# Patient Record
Sex: Female | Born: 1965 | Race: White | Hispanic: No | Marital: Married | Smoking: Current every day smoker
Health system: Southern US, Community
[De-identification: ages and names within clinical notes are randomized; demographics above are authoritative.]

## PROBLEM LIST (undated history)

## (undated) DIAGNOSIS — T8859XA Other complications of anesthesia, initial encounter: Secondary | ICD-10-CM

## (undated) DIAGNOSIS — I5181 Takotsubo syndrome: Secondary | ICD-10-CM

## (undated) DIAGNOSIS — M797 Fibromyalgia: Secondary | ICD-10-CM

## (undated) DIAGNOSIS — G4730 Sleep apnea, unspecified: Secondary | ICD-10-CM

## (undated) DIAGNOSIS — M5430 Sciatica, unspecified side: Secondary | ICD-10-CM

## (undated) DIAGNOSIS — Z9889 Other specified postprocedural states: Secondary | ICD-10-CM

## (undated) DIAGNOSIS — F419 Anxiety disorder, unspecified: Secondary | ICD-10-CM

## (undated) DIAGNOSIS — M1610 Unilateral primary osteoarthritis, unspecified hip: Secondary | ICD-10-CM

## (undated) DIAGNOSIS — J449 Chronic obstructive pulmonary disease, unspecified: Secondary | ICD-10-CM

## (undated) DIAGNOSIS — L509 Urticaria, unspecified: Secondary | ICD-10-CM

## (undated) DIAGNOSIS — J9690 Respiratory failure, unspecified, unspecified whether with hypoxia or hypercapnia: Secondary | ICD-10-CM

## (undated) DIAGNOSIS — T4145XA Adverse effect of unspecified anesthetic, initial encounter: Secondary | ICD-10-CM

## (undated) HISTORY — DX: Urticaria, unspecified: L50.9

## (undated) HISTORY — DX: Fibromyalgia: M79.7

## (undated) HISTORY — DX: Takotsubo syndrome: I51.81

## (undated) HISTORY — DX: Sciatica, unspecified side: M54.30

## (undated) HISTORY — DX: Unilateral primary osteoarthritis, unspecified hip: M16.10

## (undated) HISTORY — PX: CARPAL TUNNEL RELEASE: SHX101

## (undated) HISTORY — DX: Anxiety disorder, unspecified: F41.9

## (undated) HISTORY — PX: BREAST SURGERY: SHX581

## (undated) HISTORY — DX: Other specified postprocedural states: Z98.890

## (undated) HISTORY — DX: Respiratory failure, unspecified, unspecified whether with hypoxia or hypercapnia: J96.90

## (undated) HISTORY — PX: CHOLECYSTECTOMY: SHX55

## (undated) HISTORY — PX: TUBAL LIGATION: SHX77

---

## 1998-03-09 ENCOUNTER — Emergency Department (HOSPITAL_COMMUNITY): Admission: EM | Admit: 1998-03-09 | Discharge: 1998-03-09 | Payer: Self-pay | Admitting: Emergency Medicine

## 2013-06-28 ENCOUNTER — Encounter: Payer: Self-pay | Admitting: Neurology

## 2013-07-01 ENCOUNTER — Ambulatory Visit: Payer: 59 | Admitting: Neurology

## 2013-07-01 ENCOUNTER — Encounter (HOSPITAL_BASED_OUTPATIENT_CLINIC_OR_DEPARTMENT_OTHER): Payer: Self-pay

## 2013-07-01 ENCOUNTER — Emergency Department (HOSPITAL_BASED_OUTPATIENT_CLINIC_OR_DEPARTMENT_OTHER)
Admission: EM | Admit: 2013-07-01 | Discharge: 2013-07-01 | Disposition: A | Discharge status: Home / Self Care | Payer: 59 | Attending: Emergency Medicine | Admitting: Emergency Medicine

## 2013-07-01 ENCOUNTER — Ambulatory Visit (INDEPENDENT_AMBULATORY_CARE_PROVIDER_SITE_OTHER): Payer: 59 | Admitting: Neurology

## 2013-07-01 ENCOUNTER — Telehealth: Payer: Self-pay | Admitting: Neurology

## 2013-07-01 ENCOUNTER — Encounter: Payer: Self-pay | Admitting: Neurology

## 2013-07-01 VITALS — BP 118/71 | HR 84 | Ht 64.5 in | Wt 134.0 lb

## 2013-07-01 DIAGNOSIS — R109 Unspecified abdominal pain: Secondary | ICD-10-CM

## 2013-07-01 DIAGNOSIS — R209 Unspecified disturbances of skin sensation: Secondary | ICD-10-CM

## 2013-07-01 DIAGNOSIS — F411 Generalized anxiety disorder: Secondary | ICD-10-CM | POA: Insufficient documentation

## 2013-07-01 DIAGNOSIS — Y9389 Activity, other specified: Secondary | ICD-10-CM | POA: Insufficient documentation

## 2013-07-01 DIAGNOSIS — Y929 Unspecified place or not applicable: Secondary | ICD-10-CM | POA: Insufficient documentation

## 2013-07-01 DIAGNOSIS — R531 Weakness: Secondary | ICD-10-CM

## 2013-07-01 DIAGNOSIS — G935 Compression of brain: Secondary | ICD-10-CM

## 2013-07-01 DIAGNOSIS — F17200 Nicotine dependence, unspecified, uncomplicated: Secondary | ICD-10-CM | POA: Insufficient documentation

## 2013-07-01 DIAGNOSIS — R202 Paresthesia of skin: Secondary | ICD-10-CM

## 2013-07-01 DIAGNOSIS — J45909 Unspecified asthma, uncomplicated: Secondary | ICD-10-CM | POA: Insufficient documentation

## 2013-07-01 DIAGNOSIS — S3981XA Other specified injuries of abdomen, initial encounter: Secondary | ICD-10-CM | POA: Insufficient documentation

## 2013-07-01 DIAGNOSIS — Z872 Personal history of diseases of the skin and subcutaneous tissue: Secondary | ICD-10-CM | POA: Insufficient documentation

## 2013-07-01 DIAGNOSIS — M1610 Unilateral primary osteoarthritis, unspecified hip: Secondary | ICD-10-CM | POA: Insufficient documentation

## 2013-07-01 DIAGNOSIS — X500XXA Overexertion from strenuous movement or load, initial encounter: Secondary | ICD-10-CM | POA: Insufficient documentation

## 2013-07-01 DIAGNOSIS — R5381 Other malaise: Secondary | ICD-10-CM

## 2013-07-01 MED ORDER — IBUPROFEN 600 MG PO TABS: 600.0000 mg | ORAL_TABLET | Freq: Four times a day (QID) | ORAL | Status: DC | PRN

## 2013-07-01 MED ORDER — METHOCARBAMOL 500 MG PO TABS: 500.0000 mg | ORAL_TABLET | Freq: Two times a day (BID) | ORAL | Status: DC

## 2013-07-01 MED ORDER — OXYCODONE-ACETAMINOPHEN 5-325 MG PO TABS: 2.0000 | ORAL_TABLET | ORAL | Status: DC | PRN

## 2013-07-01 MED ORDER — OXYCODONE-ACETAMINOPHEN 5-325 MG PO TABS
2.0000 | ORAL_TABLET | Freq: Once | ORAL | Status: AC
  Administered 2013-07-01: 2 via ORAL
  Filled 2013-07-01: qty 2

## 2013-07-01 NOTE — ED Notes (Signed)
Pt reports abdominal pain and "spasms" that has been intermittent x 1 week.

## 2013-07-01 NOTE — Progress Notes (Addendum)
[facility]  Provider:  Dr Hosie Poisson Referring Provider: Emilee Hero,* Primary Care Physician:  Bunnie Philips, MD  CC:  Chronic pain   HPI:  Tracey Randolph is a 47 y.o. female here as a referral from Dr. Yevette Edwards for pain. Patient is currently prone on exam bed, stating she is in extreme pain. Pain is all over, non localizing. Reports burning sensation all over, has spasms in her legs. Recently had MRI Cervical spine done, they are unclear what it showed but state ortho told them there is no surgical intervention needed. Diagnosed with fibromyalgia around 1 year ago by PCP, started on Cymbalta. Saw another neurologist, Dr Craige Cotta at [facility] who told her she has fibromyalgia and sciatica of her left leg. Neither any visual changes vision loss. Does note episodes of lightheadedness, dizziness, denies any vertigo. The last few days has noticed bulge in her abdominal region, this resolves when she lies flat.  Per referral notes: Long-standing pain in neck, low back, bilateral arms and bilateral legs. Has spasms in LLE. Some difficulty sleeping. Generalized body aching and numbness. Feels balance is worse, having multiple falls. 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 Has a diagnosis of fibromyalgia. Currently takes Neurontin 600mg  BID and Cymbalta 90mg  daily.   Review of Systems: Out of a complete 14 system review, the patient complains of only the following symptoms, and all other reviewed systems are negative. Positive for weight gain fatigue blurred vision eye pain chest pain ringing in ears term pink cramps aching muscles constipation rash or loss confusion headache numbness weakness slurred speech dizziness restless leg anxiety none of sleep decreased energy change in appetite hallucinations  History   Social History  . Marital Status: Married    Spouse Name: N/A    Number of Children: 3  . Years of  Education: GED   Occupational History  .      [employer]   Social History Main Topics  . Smoking status: Current Every Day Smoker    Types: Cigarettes  . Smokeless tobacco: Never Used     Comment: 1to 1 1/2 daily  . Alcohol Use: No  . Drug Use: No  . Sexual Activity: Not on file   Other Topics Concern  . Not on file   Social History Narrative   The patient consumes several glasses of mt dew daily.    No family history on file.  Past Medical History  Diagnosis Date  . Fibromyalgia   . Sciatic nerve pain   . Anxiety   . Urticaria   . Hip arthritis     Past Surgical History  Procedure Laterality Date  . Carpal tunnel release    . Cesarean section      Current Outpatient Prescriptions  Medication Sig Dispense Refill  . ALPRAZolam (XANAX PO) Take by mouth.      . Aspirin-Acetaminophen-Caffeine (GOODY HEADACHE PO) Take by mouth.      . Cetirizine HCl (ZYRTEC PO) Take by mouth.      . DiphenhydrAMINE HCl (BENADRYL PO) Take by mouth.      . DULoxetine HCl (CYMBALTA PO) Take by mouth.      . Gabapentin (NEURONTIN PO) Take by mouth.      . TRAMADOL HCL PO Take by mouth.       No current facility-administered medications for this visit.    Allergies as of 07/01/2013  . (No Known Allergies)  Vitals: BP 118/71  Pulse 84  Ht 5' 4.5" (1.638 m)  Wt 134 lb (60.782 kg)  BMI 22.65 kg/m2 Last Weight:  Wt Readings from Last 1 Encounters:  07/01/13 134 lb (60.782 kg)   Last Height:   Ht Readings from Last 1 Encounters:  07/01/13 5' 4.5" (1.638 m)     Physical exam: Exam: Gen: patient prone on bed, reporting severe pain, not appearing in any distress Eyes: anicteric sclerae, moist conjunctivae HENT: Atraumatic, oropharynx clear Neck: Trachea midline; supple,  Lungs: CTA, no wheezing, rales, rhonic                          CV: RRR, no MRG Abdomen: Soft, non-tender;  Extremities: No peripheral edema  Skin: Normal temperature, no rash,  Psych:  Appropriate affect, pleasant  Neuro: Tracey: AA&Ox3, appropriately interactive, normal affect   Speech: fluent w/o paraphasic error  Memory: good recent and remote recall  CN: PERRL, EOMI no nystagmus, no ptosis, sensation intact to LT V1-V3 bilat, face symmetric, no weakness, hearing grossly intact, palate elevates symmetrically, shoulder shrug 5/5 bilat,  tongue protrudes midline, no fasiculations noted. Mild "no-no" head tremor noted  Motor: normal bulk and tone Strength: 5/5  In all extremities, noted give-way weakness in UE and LE  Coord: rapid alternating and point-to-point (FNF, HTS) movements intact. Mild bilateral intention tremor Reflexes: symmetrical, bilat downgoing toes  Sens: LT intact in all extremities  Gait: slow, antalgic gait, favoring left leg, wobbles/gets off balance with tandem but able to correct herself wtihout assistance, wobbles/falls but self corrects self in [place]  Assessment:  After physical and neurologic examination, review of laboratory studies, imaging, neurophysiology testing and pre-existing records, assessment will be reviewed on the problem list.  Plan:  Treatment plan and additional workup will be reviewed under Problem List.  Tracey Randolph is a pleasant 47y/o woman sent in for initial evaluation of chronic  pain, muscle spasms, gait instability. Has been ongoing for over a year. Has a diagnosis of fibromyalgia and is taking Gabapentin and Cymbalta 90 mg daily for this. With little to no relief with these medications. Is scheduled to see rheumatology in the future for further workup and treatment. Has recently been evaluated by orthopedic surgery with recent MRI of the C-spine per the patient they found no surgical indication. She's never had MRI of the brain. Unclear etiology of her symptoms, with age and diffuse symptoms would question possible multiple sclerosis. There does appear to be some functional overlay to her symptoms. Will check brain MRI with  and without contrast. Patient scheduled to followup with rheumatology. His patient to followup with primary care physician for evaluation of possible abdominal hernia.   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 Unlikely to fully explain patients presenting symptoms. Discussed results with patient, wishes to see neurosurgery for formal evaluation. Referral to be placed.

## 2013-07-01 NOTE — ED Provider Notes (Signed)
CSN: [id number]     Arrival date & time 07/01/13  1333 History   First MD Initiated Contact with Patient 07/01/13 1336     Chief Complaint  Patient presents with  . Abdominal Pain   (Consider location/radiation/quality/duration/timing/severity/associated sxs/prior Treatment) Patient is a 47 y.o. female presenting with abdominal pain. The history is provided by the patient.  Abdominal Pain Associated symptoms: no vaginal bleeding and no vaginal discharge    patient here complaining of abdominal wall pain and spasms asthma week after lifting a heavy tire. No associated fever or chills. No vomiting or diarrhea. No weakness in her legs. Denies any urinary symptoms. Pain characterized as sharp and worse with certain movements. Used her home medications without relief. Denies any back pain.  Past Medical History  Diagnosis Date  . Fibromyalgia   . Sciatic nerve pain   . Anxiety   . Urticaria   . Hip arthritis    Past Surgical History  Procedure Laterality Date  . Carpal tunnel release Right   . Cesarean section     Family History  Problem Relation Age of Onset  . Heart Problems Brother   . Heart Problems Maternal Grandfather    History  Substance Use Topics  . Smoking status: Current Every Day Smoker -- 2.00 packs/day    Types: Cigarettes  . Smokeless tobacco: Never Used     Comment: 1to 1 1/2 daily  . Alcohol Use: No   OB History   Grav Para Term Preterm Abortions TAB SAB Ect Mult Living                 Review of Systems  Gastrointestinal: Positive for abdominal pain.  Genitourinary: Negative for vaginal bleeding and vaginal discharge.  All other systems reviewed and are negative.    Allergies  Review of patient's allergies indicates no known allergies.  Home Medications   Current Outpatient Rx  Name  Route  Sig  Dispense  Refill  . ALPRAZolam (XANAX PO)   Oral   Take by mouth.         . Aspirin-Acetaminophen-Caffeine (GOODY HEADACHE PO)   Oral   Take by  mouth.         . Cetirizine HCl (ZYRTEC PO)   Oral   Take by mouth.         . DiphenhydrAMINE HCl (BENADRYL PO)   Oral   Take by mouth.         . DULoxetine HCl (CYMBALTA PO)   Oral   Take by mouth.         . Gabapentin (NEURONTIN PO)   Oral   Take by mouth.         . TRAMADOL HCL PO   Oral   Take by mouth.          BP 145/92  Pulse 88  Temp(Src) 98.4 F (36.9 C) (Oral)  Resp 16  Ht 5\' 3"  (1.6 m)  Wt 134 lb (60.782 kg)  BMI 23.74 kg/m2  SpO2 98% Physical Exam  Nursing note and vitals reviewed. Constitutional: She is oriented to person, place, and time. She appears well-developed and well-nourished.  Non-toxic appearance. No distress.  HENT:  Head: Normocephalic and atraumatic.  Eyes: Conjunctivae, EOM and lids are normal. Pupils are equal, round, and reactive to light.  Neck: Normal range of motion. Neck supple. No tracheal deviation present. No mass present.  Cardiovascular: Normal rate, regular rhythm and normal heart sounds.  Exam reveals no gallop.   No  murmur heard. Pulmonary/Chest: Effort normal and breath sounds normal. No stridor. No respiratory distress. She has no decreased breath sounds. She has no wheezes. She has no rhonchi. She has no rales.  Abdominal: Soft. Normal appearance and bowel sounds are normal. She exhibits no distension. There is tenderness in the right lower quadrant, suprapubic area and left lower quadrant. There is no rigidity, no rebound, no guarding and no CVA tenderness. No hernia. Hernia confirmed negative in the ventral area, confirmed negative in the right inguinal area and confirmed negative in the left inguinal area.    No bruising appreciated. No erythema to the skin. Patient's abdomen is nonsurgical  Musculoskeletal: Normal range of motion. She exhibits no edema and no tenderness.  Neurological: She is alert and oriented to person, place, and time. She has normal strength. No cranial nerve deficit or sensory deficit.  GCS eye subscore is 4. GCS verbal subscore is 5. GCS motor subscore is 6.  Skin: Skin is warm and dry. No abrasion and no rash noted.  Psychiatric: She has a normal mood and affect. Her speech is normal and behavior is normal.    ED Course  Procedures (including critical care time) Labs Review Labs Reviewed - No data to display Imaging Review No results found.  MDM  No diagnosis found. Patient given Percocet for pain. Suspect that she has abdominal wall pain at this time. We'll treat appropriately    Toy Baker, MD 07/01/13 [phone]

## 2013-07-01 NOTE — Patient Instructions (Addendum)
Overall you are doing fairly well but I do want to suggest a few things today:   As far as diagnostic testing: I would like to get a MRI of the brain  I would like you to follow up with rheumatology for further evaluation of your fibromyalgia.  Please follow up with your primary care physician for evaluation of a possible abdominal hernia.  Please also call us for any test results so we can go over those with you on the phone.  My clinical assistant and will answer any of your questions and relay your messages to me and also relay most of my messages to you.   Our phone number is [phone]. We also have an after hours call service for urgent matters and there is a physician on-call for urgent questions. For any emergencies you know to call 911 or go to the nearest emergency room

## 2013-07-02 NOTE — Telephone Encounter (Signed)
I see pt went to ED.

## 2013-07-04 ENCOUNTER — Ambulatory Visit (INDEPENDENT_AMBULATORY_CARE_PROVIDER_SITE_OTHER): Payer: 59

## 2013-07-04 DIAGNOSIS — R209 Unspecified disturbances of skin sensation: Secondary | ICD-10-CM

## 2013-07-04 DIAGNOSIS — R202 Paresthesia of skin: Secondary | ICD-10-CM

## 2013-07-04 DIAGNOSIS — R5381 Other malaise: Secondary | ICD-10-CM

## 2013-07-04 DIAGNOSIS — R531 Weakness: Secondary | ICD-10-CM

## 2013-07-05 MED ORDER — GADOPENTETATE DIMEGLUMINE 469.01 MG/ML IV SOLN: 12.0000 mL | Freq: Once | INTRAVENOUS | Status: AC | PRN

## 2013-07-06 ENCOUNTER — Encounter: Payer: Self-pay | Admitting: Neurology

## 2013-07-08 ENCOUNTER — Telehealth: Payer: Self-pay | Admitting: Neurology

## 2013-07-08 NOTE — Addendum Note (Signed)
Addended by: Ramond Marrow on: 07/08/2013 05:17 PM   Modules accepted: Orders

## 2013-07-09 ENCOUNTER — Telehealth: Payer: Self-pay | Admitting: Neurology

## 2013-07-09 ENCOUNTER — Encounter: Payer: Self-pay | Admitting: Neurology

## 2013-07-11 ENCOUNTER — Encounter: Payer: Self-pay | Admitting: Neurology

## 2013-07-11 ENCOUNTER — Other Ambulatory Visit: Payer: Self-pay | Admitting: Neurology

## 2013-07-11 NOTE — Telephone Encounter (Signed)
Patient states she is having trouble sleeping. She is requesting med management or a med to help with sleep.

## 2013-07-11 NOTE — Telephone Encounter (Signed)
Replied to patients email request for same issue. Started her on Melatonin 5mg  nightly

## 2013-08-08 ENCOUNTER — Other Ambulatory Visit: Payer: Self-pay

## 2013-08-20 ENCOUNTER — Telehealth: Payer: Self-pay | Admitting: Neurology

## 2013-08-20 NOTE — Telephone Encounter (Signed)
MRI inquiring about  was  From another doctor's office,explained to patient , she understood but  wanted to speak with Dr Hosie Poisson concerning report from hand surgeon. Informed patient that we did not have report as of yet, patient will have their  office fax  and will give to Dr Hosie Poisson for review.

## 2013-08-22 ENCOUNTER — Telehealth: Payer: Self-pay | Admitting: Neurology

## 2013-08-22 NOTE — Telephone Encounter (Signed)
Received fax copy of ov notes from Dr Val Riles office, informed patient

## 2013-08-22 NOTE — Telephone Encounter (Signed)
Called patient back to discuss recent MRI findings and NSX visit. Patient does not agree with NSX findings that there is no surgical indication. Explained to her that she should keep her planned rheumatology appointment to see what they have to say. I counseled her that I agree with NSX that her symptoms are not coming from the [name]. She expressed understanding.

## 2013-08-22 NOTE — Telephone Encounter (Signed)
Have called Dr Val Riles office and they will fax over the Office notes to our office for Dr Hosie Poisson to review, called patient to inform that once he reviews, will contact patient

## 2013-08-28 ENCOUNTER — Telehealth: Payer: Self-pay | Admitting: Neurology

## 2013-08-28 NOTE — Telephone Encounter (Signed)
Spoke with patient and she said that she wants to speak with Dr Hosie Poisson concerning a research on [name] study that she is interested in participating with.  It is [facility]-[email or web address].

## 2013-09-02 NOTE — Telephone Encounter (Signed)
Patient called, answered her questions. She continues to have concerns over [name] which she believes is causing her symptoms. I again reiterated to her that I believe, as does neurosurgery, that this was likely an incidental finding. She wishes to have a 2nd opinion and will consider following up with [facility] or [facility]

## 2014-10-23 ENCOUNTER — Telehealth: Payer: Self-pay | Admitting: Neurology

## 2014-10-23 NOTE — Telephone Encounter (Signed)
Pt called in she saw Dr Hosie PoissonSumner in 2014 she wants to know if we do any kind of testing for [name] Please call [phone] dg

## 2014-10-27 NOTE — Telephone Encounter (Signed)
Patient calling back regarding message left on 10/23/14.  Please call and advise.

## 2014-10-30 NOTE — Telephone Encounter (Signed)
I called pt and [name] was not what she wanted to know about.  [name] or [name] test was what she is asking about.  I relayed that [facility] is the speciality lab that we use for special neuro tests.  [facility]wn by [employer]. After orders per MD, call placed to [place] to see if insurance in network and then insurance called.  Pt stated she called and her insurance does cover.  She has to pay on her deductible.   Pts brother had this genetic disorder.

## 2014-11-05 ENCOUNTER — Ambulatory Visit (INDEPENDENT_AMBULATORY_CARE_PROVIDER_SITE_OTHER): Payer: 59 | Admitting: Neurology

## 2014-11-05 ENCOUNTER — Encounter: Payer: Self-pay | Admitting: Neurology

## 2014-11-05 VITALS — BP 127/80 | HR 103 | Ht 63.0 in | Wt 148.0 lb

## 2014-11-05 DIAGNOSIS — E538 Deficiency of other specified B group vitamins: Secondary | ICD-10-CM

## 2014-11-05 DIAGNOSIS — G609 Hereditary and idiopathic neuropathy, unspecified: Secondary | ICD-10-CM

## 2014-11-05 NOTE — Patient Instructions (Signed)
Overall you are doing fairly well but I do want to suggest a few things today:   Remember to drink plenty of fluid, eat healthy meals and do not skip any meals. Try to eat protein with a every meal and eat a healthy snack such as fruit or nuts in between meals. Try to keep a regular sleep-wake schedule and try to exercise daily, particularly in the form of walking, 20-30 minutes a day, if you can.   As far as diagnostic testing: labs  Please call us with any interim questions, concerns, problems, updates or refill requests.   Please also call us for any test results so we can go over those with you on the phone.  My clinical assistant and will answer any of your questions and relay your messages to me and also relay most of my messages to you.   Our phone number is [phone]. We also have an after hours call service for urgent matters and there is a physician on-call for urgent questions. For any emergencies you know to call 911 or go to the nearest emergency room

## 2014-11-05 NOTE — Progress Notes (Addendum)
[facility]    Provider:  Dr Lucia GaskinsAhern Referring Provider: No ref. provider found Primary Care Physician:  ADAM, Sanda KleinSOHEIR SAEED, MD  CC:  [name]  HPI:  Tracey Randolph is a 49 y.o. female here as a follow up. She is a former patient of Dr. Hosie PoissonSumner. I have never seen her and she is a new patient to me and unfortunately she was only scheduled for a 15 minute follow up appointment slot despite being a new patient to me. She has multiple symptoms and complaints however today she is interested in discussing Charcot-Marie tooth. Her brother passed away and he had [name]. She reports that she is having the same problems that he was. She is having neck pain, numbness in her hands, pain in her arms, she wakes up every morning and her toes are curled up, her feet burn and hurt badly, she has weakness in her left leg, she is "sweating like crazy", she has bad headaches, she has insomnia. She had respiratory symptoms, recently and [facility]. Then [facility], she had stress-induced cardiomyopathy, she is having chest pain, chronic abdominal pain, chronic whole body pain. She reports she was active when she was young, but her legs hurt at night "and kicked and kicked". Her knees swell. Hands swell. She reports falls, trips a lot. She runs into the wall.  She follows with a rhematologist who has worked her up extensively. Has been to multiple rheumatologists.   MRI of the brain: Normal MRI scan of the brain with and without contrast. 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  Initial visit 07/01/2013 with Dr. Hosie PoissonSumner: Tracey Randolph is a 49 y.o. female here as a referral from Dr. Yevette Edwardsumonski for pain. Patient is currently prone on exam bed, stating she is in extreme pain. Pain is all over, non localizing. Reports burning sensation all over, has spasms in her legs. Recently had MRI Cervical spine done, they are unclear what it showed but state ortho told them  there is no surgical intervention needed. Diagnosed with fibromyalgia around 1 year ago by PCP, started on Cymbalta. Saw another neurologist, [facility]ir[facility] who told her she has fibromyalgia and sciatica of her left leg. Neither any visual changes vision loss. Does note episodes of lightheadedness, dizziness, denies any vertigo. The last few days has noticed bulge in her abdominal region, this resolves when she lies flat.  Per referral notes: Long-standing pain in neck, low back, bilateral arms and bilateral legs. Has spasms in LLE. Some difficulty sleeping. Generalized body aching and numbness. Feels balance is worse, having multiple falls. 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 Has a diagnosis of fibromyalgia. Currently takes Neurontin 600mg  BID and Cymbalta 90mg  daily  Review of Systems: Patient complains of symptoms per HPI as well as the following symptoms activity change, appetite change, fever, fatigue, wt change, sweating, light sensitivity, loss of vision, blurred vision, cold and heat intolerance, facial swelling, ringing in ears, trouble swallowing, CP, constipation, nausea, frequency or urination, blood in urine, joint pain and swelling, back pain, aching muscles, muscle cramps, walking difficulty, neck pain and stiffness, agitation, memory loss, dizziness, headache, numbness, speech difficulty, weakness, agitation, behavior problem, confusion, nervous/anxious. . Pertinent negatives per HPI. All others negative.   History   Social History  . Marital Status: Married    Spouse Name: N/A    Number of Children: 3  . Years  of Education: GED   Occupational History  .      [employer]   Social History Main Topics  . Smoking status: Current Every Day Smoker -- 2.00 packs/day    Types: Cigarettes  . Smokeless tobacco: Never Used     Comment: 1to 1 1/2 daily  . Alcohol Use: No  . Drug Use: No  . Sexual Activity: Not on file     Other Topics Concern  . Not on file   Social History Narrative   The patient consumes several glasses of mt dew daily.    Family History  Problem Relation Age of Onset  . Heart Problems Brother   . Heart Problems Maternal Grandfather     Past Medical History  Diagnosis Date  . Fibromyalgia   . Sciatic nerve pain   . Anxiety   . Urticaria   . Hip arthritis     Past Surgical History  Procedure Laterality Date  . Carpal tunnel release Right   . Cesarean section      Current Outpatient Prescriptions  Medication Sig Dispense Refill  . ALPRAZolam (XANAX PO) Take 5 mg by mouth.     . DiphenhydrAMINE HCl (BENADRYL PO) Take by mouth as needed (itching).     Marland Kitchen albuterol (PROVENTIL HFA;VENTOLIN HFA) 108 (90 BASE) MCG/ACT inhaler Inhale 2 puffs into the lungs.    . Cholecalciferol (VITAMIN D3) 5000 UNITS TABS Take 1 tablet by mouth daily.    . clemastine (TAVIST) 2.68 MG TABS tablet Take 2.68 mg by mouth 2 (two) times daily.    . montelukast (SINGULAIR) 10 MG tablet Take 10 mg by mouth.    . nortriptyline (PAMELOR) 25 MG capsule Take 25 mg by mouth.    Marland Kitchen omalizumab (XOLAIR) 150 MG injection Inject 150 mg into the skin.    Marland Kitchen omeprazole (PRILOSEC) 20 MG capsule Take 20 mg by mouth.    . Oxycodone HCl 10 MG TABS Take 10 mg by mouth every 8 (eight) hours.  0  . polyethylene glycol powder (GLYCOLAX/MIRALAX) powder Take by mouth.    . predniSONE (DELTASONE) 10 MG tablet Take 10 mg by mouth daily.  3  . venlafaxine (EFFEXOR) 75 MG tablet Take 150 mg by mouth.     No current facility-administered medications for this visit.    Allergies as of 11/05/2014 - Review Complete 07/01/2013  Allergen Reaction Noted  . Cefuroxime Rash 11/05/2014  . Ipratropium-albuterol Rash 11/05/2014  . Morphine Rash 11/05/2014    Vitals: Ht  (1.6 m)  Wt 148 lb (67.132 kg)  BMI 26.22 kg/m2 Last Weight:  Wt Readings from Last 1 Encounters:  11/05/14 148 lb (67.132 kg)   Last Height:   Ht  Readings from Last 1 Encounters:  11/05/14  (1.6 m)    FOCUSED NEURO EXAM:   Speech:    Speech is normal; fluent and spontaneous with normal comprehension.  Cognition:    The patient is oriented to person, place, and time;     Cranial Nerves:    The pupils are equal, round, and reactive to light.  Visual fields are full to finger confrontation. Extraocular movements are intact. Trigeminal sensation is intact. The face is symmetric. The palate elevates in the midline. Hearing grossly intact. Voice is normal. Shoulder shrug is normal. The tongue has normal motion without fasciculations.   Motor Observation:    Distal tapering/calf atrophy in the lower extremities, mildly high arches, no hammer toes,  Tone:  Normal muscle tone.    Posture:    Posture is normal.     Strength:    Give-way throughout     Sensation:  Decreased pin prick to knees and to deltoids, vibration and proprioception impaired in the great toes     Reflex Exam:  DTR's:  absent achilles DTRs,  Intact Patellar reflexes,   Assessment/Plan:  Ms Cressler is a 49y/o woman originally evaluated for chronic pain, muscle spasms, gait instability. Has a diagnosis of fibromyalgia and is taking Gabapentin and Cymbalta 90 mg daily for this. Is following with rheumatology. Has recently been evaluated by orthopedic surgery with recent MRI of the C-spine per the patient they found no surgical indication. MRI of the brain was unremarkable. She is here because her brother had [name] and she thinks she may have it too. She does have mildly high arches and tapering/ calf muscle atrophy distally in the legs. She had decrease to all sensory distally as well as absent achilles DTRs. Will perform neuropathy serum panel. Discussed [name] with patient and the different diagnostic modalities including emg/ncs (per notes, emg/ncs was already done in the past, would need to get the records and/or repeat procedure), nerve biopsy and Athena genetic  testing. Patient is interested in [place] genetic testing and would like the information. Discussed at length with her.    Naomie Dean, MD  [facility] [address] [place], [place] [address]  Phone [phone] Fax [phone]  A total of 30 minutes face-to-face was spent in with this patient. Over half this time was spent on counseling patient on [name] and different diagnostic and therapeutic options available.

## 2014-11-07 ENCOUNTER — Telehealth: Payer: Self-pay | Admitting: Neurology

## 2014-11-07 NOTE — Telephone Encounter (Signed)
Patient is calling for lab results. Please call. Thank you.

## 2014-11-10 ENCOUNTER — Telehealth: Payer: Self-pay | Admitting: *Deleted

## 2014-11-10 LAB — COMPREHENSIVE METABOLIC PANEL
A/G RATIO: 2.2 (ref 1.1–2.5)
ALK PHOS: 57 IU/L (ref 39–117)
ALT: 19 IU/L (ref 0–32)
AST: 17 IU/L (ref 0–40)
Albumin: 4.3 g/dL (ref 3.5–5.5)
BUN/Creatinine Ratio: 11 (ref 9–23)
BUN: 7 mg/dL (ref 6–24)
Bilirubin Total: <0.2 mg/dL (ref 0.0–1.2)
CHLORIDE: 98 mmol/L (ref 97–108)
CO2: 25 mmol/L (ref 18–29)
CREATININE: 0.61 mg/dL (ref 0.57–1.00)
Calcium: 10 mg/dL (ref 8.7–10.2)
GFR, EST AFRICAN AMERICAN: 122 mL/min/{1.73_m2} (ref >59)
GFR, EST NON AFRICAN AMERICAN: 106 mL/min/{1.73_m2} (ref >59)
Globulin, Total: 2 g/dL (ref 1.5–4.5)
Glucose: 61 mg/dL — ABNORMAL LOW (ref 65–99)
POTASSIUM: 4.4 mmol/L (ref 3.5–5.2)
SODIUM: 142 mmol/L (ref 134–144)
Total Protein: 6.3 g/dL (ref 6.0–8.5)

## 2014-11-10 LAB — HEMOGLOBIN A1C
Est. average glucose Bld gHb Est-mCnc: 111 mg/dL
Hgb A1c MFr Bld: 5.5 % (ref 4.8–5.6)

## 2014-11-10 LAB — IFE AND PE, SERUM
ALBUMIN/GLOB SERPL: 1.8 (ref 0.7–2.0)
ALPHA2 GLOB SERPL ELPH-MCNC: 0.7 g/dL (ref 0.4–1.2)
Albumin SerPl Elph-Mcnc: 4 g/dL (ref 3.2–5.6)
Alpha 1: 0.2 g/dL (ref 0.1–0.4)
B-GLOBULIN SERPL ELPH-MCNC: 0.8 g/dL (ref 0.6–1.3)
GLOBULIN, TOTAL: 2.3 g/dL (ref 2.0–4.5)
Gamma Glob SerPl Elph-Mcnc: 0.5 g/dL (ref 0.5–1.6)
IgA/Immunoglobulin A, Serum: 124 mg/dL (ref 91–414)
IgG (Immunoglobin G), Serum: 569 mg/dL — ABNORMAL LOW (ref 700–1600)
IgM (Immunoglobulin M), Srm: 48 mg/dL (ref 40–230)

## 2014-11-10 LAB — HIV ANTIBODY (ROUTINE TESTING W REFLEX): HIV Screen 4th Generation wRfx: NONREACTIVE

## 2014-11-10 LAB — CBC
HCT: 39 % (ref 34.0–46.6)
HEMOGLOBIN: 13.3 g/dL (ref 11.1–15.9)
MCH: 31.9 pg (ref 26.6–33.0)
MCHC: 34.1 g/dL (ref 31.5–35.7)
MCV: 94 fL (ref 79–97)
Platelets: 358 10*3/uL (ref 150–379)
RBC: 4.17 x10E6/uL (ref 3.77–5.28)
RDW: 13.4 % (ref 12.3–15.4)
WBC: 13.4 10*3/uL — AB (ref 3.4–10.8)

## 2014-11-10 LAB — B12 AND FOLATE PANEL
Folate: >20 ng/mL (ref >3.0)
Vitamin B-12: 481 pg/mL (ref 211–946)

## 2014-11-10 LAB — RPR: RPR: NONREACTIVE

## 2014-11-10 LAB — VITAMIN B1, WHOLE BLOOD: THIAMINE: 207.7 nmol/L — AB (ref 66.5–200.0)

## 2014-11-10 LAB — CRYOGLOBULIN, QL, SERUM, RFLX

## 2014-11-10 LAB — HEPATITIS C ANTIBODY: Hep C Virus Ab: <0.1 s/co ratio (ref 0.0–0.9)

## 2014-11-10 NOTE — Telephone Encounter (Signed)
Pt is calling again requesting her lab results. Please call and advise.

## 2014-11-10 NOTE — Telephone Encounter (Signed)
Pt is calling back to speak back with Kara MeadEmma with more questions regarding her lab results.  Please call and advise.

## 2014-11-10 NOTE — Telephone Encounter (Signed)
Talked with patient about Athena diagnostic testing. She wanted Dr. Lucia GaskinsAhern advise on whether to proceed with testing. Dr. Lucia GaskinsAhern will call patient back.

## 2014-11-10 NOTE — Telephone Encounter (Signed)
Left patient a voicemail to give us a call back .

## 2014-11-10 NOTE — Telephone Encounter (Signed)
Returning call.

## 2014-11-10 NOTE — Telephone Encounter (Signed)
Talked with patient about lab results. Patient verbalized understanding. Also spoke with patient about Athena diagnostics and gave her the cost that Jake Samplesthena estimated for as well as what her insurance would cover. I advised patient to also call insurance to find out the coverage. Patient stated she would call back to let us know if she wants to proceed with the lab testing.

## 2014-11-11 ENCOUNTER — Telehealth: Payer: Self-pay | Admitting: Neurology

## 2014-11-11 NOTE — Telephone Encounter (Signed)
Patient is returning your call of 2/9 approximately 4:15.  Please call.

## 2014-11-11 NOTE — Telephone Encounter (Signed)
I called and left message. It is up to patient whether she wants to do genetic testing. It would not change her management at this time. Thank you.

## 2014-11-12 NOTE — Telephone Encounter (Signed)
I called. Patient was not there again. I left a message, feel that it is up to patient whether she wants to spend the money to have [name] genetic testing. At this point it would not change our management. Thank you.

## 2014-11-13 ENCOUNTER — Encounter: Payer: Self-pay | Admitting: Neurology

## 2014-11-15 ENCOUNTER — Encounter: Payer: Self-pay | Admitting: Neurology

## 2014-11-18 ENCOUNTER — Telehealth: Payer: Self-pay | Admitting: *Deleted

## 2014-11-18 ENCOUNTER — Other Ambulatory Visit: Payer: Self-pay | Admitting: Neurology

## 2014-11-18 DIAGNOSIS — G609 Hereditary and idiopathic neuropathy, unspecified: Secondary | ICD-10-CM

## 2014-11-21 ENCOUNTER — Telehealth: Payer: Self-pay | Admitting: Neurology

## 2014-11-21 NOTE — Telephone Encounter (Signed)
Dr. Lucia GaskinsAhern already talked with this patient. See Dr. Trevor MaceAhern's notes.

## 2014-11-21 NOTE — Telephone Encounter (Signed)
Spoke to patient. Discussed [name] with patient and the different diagnostic modalities including emg/ncs (per notes, emg/ncs was already done in the past, would need to get the records and/or repeat procedure), nerve biopsy and Athena genetic testing. Patient is interested in [place] genetic testing. She would like to see a neuromuscular expert regarding diagnosis of [name]. I told her there is a Magazine features editorwonderful Neuromuscular doctor at [facility] and I am happy to refer patient. Thank you.

## 2014-12-18 ENCOUNTER — Ambulatory Visit (INDEPENDENT_AMBULATORY_CARE_PROVIDER_SITE_OTHER): Payer: 59 | Admitting: Neurology

## 2014-12-18 ENCOUNTER — Encounter: Payer: Self-pay | Admitting: Neurology

## 2014-12-18 VITALS — BP 130/86 | HR 76 | Ht 63.0 in | Wt 147.1 lb

## 2014-12-18 DIAGNOSIS — F32A Depression, unspecified: Secondary | ICD-10-CM

## 2014-12-18 DIAGNOSIS — G894 Chronic pain syndrome: Secondary | ICD-10-CM

## 2014-12-18 DIAGNOSIS — F329 Major depressive disorder, single episode, unspecified: Secondary | ICD-10-CM

## 2014-12-18 DIAGNOSIS — R269 Unspecified abnormalities of gait and mobility: Secondary | ICD-10-CM

## 2014-12-18 DIAGNOSIS — R202 Paresthesia of skin: Secondary | ICD-10-CM

## 2014-12-18 NOTE — Patient Instructions (Addendum)
Please call my office if you would like us to move forward with scheduling the nerve conduction study/EMG or order Tracey Randolph genetic testing for [name].

## 2014-12-18 NOTE — Progress Notes (Signed)
[facility] Clinic Note - Initial Visit   Date: 12/18/2014   Tracey Randolph MRN: [id number] DOB: 10/18/1965   Dear Dr. Lucia Gaskins:  Thank you for your kind referral of Tracey Randolph for consultation of neuropathy. Although her history is well known to you, please allow [place] to reiterate it for the purpose of our medical record. The patient was accompanied to the clinic by husband who also provides collateral information.     History of Present Illness: Tracey Randolph is a 49 y.o. 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    Starting around 2013, she reports having numbness or tingling of the arm and feet.  Over the years, she feels that her entire arm hurts, pain feels like a dull throbbing.  She does not have any sharp shooting pain down her arms.  Numbness/tingling involves her entire upper extremity (shoulder, upper arm, forearm, hands), groin, tail bone, and entire leg (thigh, legs, feet).  She feels that her toes are curling under.  She wears heavy socks, because her feet feel as if she is in ice water.  Walking makes it worse.  Nothing that alleviates her pain.   She was having severe back spasms, previously was active and now has difficulty walking even 6 feet.  She walks with a cane and walker for the past two years.  She has fallen about 5-6 times last year, and fortunately has not had any severe injuries.  Patient has previously seen a number of providers including four neurologists, three rheumatologists, neurosurgeon, and Location manager.  I am the fifth neurologist she is seeing. She also sees pain management, Dr. Anette Riedel in [place], and has a psychiatrist that she sees.  Patient has a number of chronic pain symptoms and carries a diagnosis of fibromyalgia. She has previously  tried Neurontin, Lyrica (started seeing snakes) and Cymbalta which did not help. She currently takes oxycodone which provides some relief.  Her most recent evaluation was at [facility] where she was first seen by Dr. Hosie Poisson in September 2014 for whole-body pain, gait instability, and muscle spasms.  Exam was notable for some functional qualities.  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  She saw Dr. 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  Her care was transitioned to Dr. Lucia Gaskins in February 2016, whose last clinic was reviewed.  Patient was very concerned about [name] a her brother passed away with it and she feels as if she has the same symptoms.  She had many other non-neurological complaints at that visit. Per patient, her brother was diagnosed with [name] by a neurologist, she does not think he had genetic testing. There is no other family history of any neuromuscular disease.  She is referred to our clinic for further evaluation for [name].  Prior to this, she saw Dr. Craige Cotta in [facility] who performed EMG which showed left sciatic nerve damage.   Out-side paper records, electronic medical record, and images have been reviewed where available and summarized as:  EMG of the lower extremities 08/28/2012:  Reduced tibial motor amplitudes and increased insertional activity in the sacral paraspinal muscles.  Sural sensory  responses were normal.     Labs 11/05/2014:  Vitamin B12 481, folate >20.0, cryoglobulin negative, SPEP/UPEP with IFE no M protein, RPR neg, HIV neg, Hepatitis C, thiamine 207, HbA1c 5.5, CMP nml  MRI brain wwo contrast 07/05/2013:  Normal, incidental finding of low lying cerebellar tonsils MRI  thoracic spine wo contrast 07/23/2012:  Normal thoracic spine.  Mild, noncompressive disc bulge at T3-4. MRI lumbar spine wo contrast 06/14/2012:  Normal   Past Medical History  Diagnosis Date  . Fibromyalgia   . Sciatic nerve pain   . Anxiety   . Urticaria   . Hip arthritis   . Respiratory failure     [facility]  . Stress-induced cardiomyopathy     [facility]     Past Surgical History  Procedure Laterality Date  . Carpal tunnel release Right   . Cesarean section       Medications:  Current Outpatient Prescriptions on File Prior to Visit  Medication Sig Dispense Refill  . albuterol (PROVENTIL HFA;VENTOLIN HFA) 108 (90 BASE) MCG/ACT inhaler Inhale 2 puffs into the lungs.    . DiphenhydrAMINE HCl (BENADRYL PO) Take by mouth as needed (itching).     . montelukast (SINGULAIR) 10 MG tablet Take 10 mg by mouth.    . nortriptyline (PAMELOR) 25 MG capsule Take 25 mg by mouth.    Marland Kitchen omeprazole (PRILOSEC) 20 MG capsule Take 20 mg by mouth.    . Oxycodone HCl 10 MG TABS Take 10 mg by mouth every 8 (eight) hours.  0  . polyethylene glycol powder (GLYCOLAX/MIRALAX) powder Take by mouth.    . predniSONE (DELTASONE) 10 MG tablet Take 10 mg by mouth daily.  3  . venlafaxine (EFFEXOR) 75 MG tablet Take 150 mg by mouth 2 (two) times daily with a meal.      No current facility-administered medications on file prior to visit.    Allergies:  Allergies  Allergen Reactions  . Cefuroxime Rash  . Ipratropium-Albuterol Rash  . Morphine Rash    "Patch form only"    Family History: Family History  Problem Relation Age of Onset  . Heart Problems Brother     Deceased  . Heart attack Maternal Grandfather   . Other Father     Multiorgan failure  . Healthy Mother     Living  . Charcot-Marie-Tooth disease Brother   . Heart attack Brother   . Healthy Son     x 2  . Healthy Daughter     x 1    Social History: History   Social History  . Marital Status: Married    Spouse  Name: N/A  . Number of Children: 3  . Years of Education: GED   Occupational History  .  Other    Owner [employer]   Social History Main Topics  . Smoking status: Former Smoker -- 2.00 packs/day for 25 years    Types: Cigarettes  . Smokeless tobacco: Never Used     Comment: 1to 1 1/2 daily, Quit December 2015  . Alcohol Use: No  . Drug Use: No  . Sexual Activity: Not on file   Other Topics Concern  . Not on file   Social History Narrative   The patient consumes several glasses of mt dew daily.     Lives with husband in a 2 story home.    Has trouble with the stairs.  Has 3 children.  Does not work.     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  Trying to get disability.     Education: some Financial trader.     Review of Systems:  CONSTITUTIONAL: No fevers, chills, night sweats, or weight loss.   EYES: No visual changes or eye pain ENT: No hearing changes.  No history of nose bleeds.   RESPIRATORY: No cough, wheezing and shortness of breath.   CARDIOVASCULAR: Negative for chest pain, and palpitations.   GI: Negative for abdominal discomfort, blood in stools or black stools.  No recent change in bowel habits.   GU:  No history of incontinence.   MUSCLOSKELETAL: +history of joint pain or swelling.  +myalgias.   SKIN: Negative for lesions, rash, and itching.   HEMATOLOGY/ONCOLOGY: Negative for prolonged bleeding, bruising easily, and swollen nodes.  No history of cancer.   ENDOCRINE: Negative for cold or heat intolerance, polydipsia or goiter.   PSYCH:  ++depression ++anxiety symptoms.   NEURO: As Above.   Vital Signs:  BP 130/86 mmHg  Pulse 76  Ht  (1.6 m)  Wt 147 lb 2 oz (66.735 kg)  BMI 26.07 kg/m2  SpO2 95%   General Medical Exam:   General: Anxious appearing, tearful at times, very shaky.   Eyes/ENT: see cranial nerve examination.   Neck:   No carotid bruits. Respiratory:  Clear to auscultation, good air entry bilaterally.     Cardiac:  Regular rate and rhythm, no murmur.   Extremities:  Mild pes cavus, no hammertoes  Skin:  Multiple skin excoriations of her forearms  Neurological Exam: MENTAL STATUS including orientation to time, place, person, recent and remote memory, attention span and concentration, language, and fund of knowledge is normal.  Speech is not dysarthric, speech can be tangential.  CRANIAL NERVES: II:  No visual field defects.  Unremarkable fundi.   III-IV-VI: Pupils equal round and reactive to light.  Normal conjugate, extra-ocular eye movements in all directions of gaze.  No nystagmus.  No ptosis.   V:  Normal facial sensation.  Jaw jerk is absent.   VII:  Normal facial symmetry and movements.  No pathologic facial reflexes.  VIII:  Normal hearing and vestibular function.   IX-X:  Normal palatal movement.   XI:  Normal shoulder shrug and head rotation.   XII:  Normal tongue strength and range of motion, no deviation or fasciculation.  MOTOR:  There is mild loss of muscle bulk over the distal lower extremity.  Accurate rating of patient's motor strength cannot be assessed as there is prominent giveaway weakness throughout. All muscle groups are at least antigravity. Tone is normal.    MSRs:  Right                                                                 Left brachioradialis 2+  brachioradialis 2+  biceps 2+  biceps 2+  triceps 2+  triceps 2+  patellar 2+  patellar 2+  ankle jerk 2+  ankle jerk 2+  Hoffman no  Hoffman no  plantar response down  plantar response down   SENSORY:  There is patchy vibrational loss over the right lower extremity the knees and distal to the ankle. Vibrational splittingis present at the for head. Pinprick, temperature, light touch is intact throughout. There is  mild sway with Romberg testing.   COORDINATION/GAIT: Normal finger-to- nose-finger.  Intact rapid alternating movements bilaterally.  Unable to rise from a chair without using arms.  Gait appears  very slow and cautious, antalgic, and assisted with a cane. There is no evidence of dragging of the feet. She is unable to perform stressed with tandem gait.    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 Patient presents with whole body pain, paresthesias, and gait difficulty.  The distribution of her pain and paresthesias does not conform to a typical pattern of neuropathy and her pain is out of portion to her exam. Further, there is nonphysiologic qualities to her clinical examination making accurate assessment of her motor strength difficult. I was able to elicit reflexes on today's exam, which were normal and symmetric. My suspicion for a hereditary neuropathy is low. I recommend repeat electrodiagnostic testing to look for axon loss and demyelinating changes, however patient does not wish to perform this test due to intolerance. Alternatively, I suggested that [name] genetic testing can be done, however this can be costly, pretest probability is low, and management would not change.  She has already contacted her insurance and [facility] and tells me that her out-of-pocket cost would be $1500.  At this point, she wishes to think about her options of EMG or genetic testing.  Additionally, she has other features of anxiety, depression, and chronic pain/fibromyalgia and is already established with pain management and psychiatry. I recommended that she see behavioral counselor for coping mechanisms and start water exercise program, as it is very important to maintain her psychological as well as physical health.   The duration of this appointment visit was 50 minutes of face-to-face time with the patient.  Greater than 50% of this time was spent in counseling, explanation of diagnosis, planning of further management, and coordination of care.   Thank you for allowing me to participate in patient's care.  If I can answer  any additional questions, I would be pleased to do so.    Sincerely,    Donika K. Allena KatzPatel, DO

## 2015-04-23 ENCOUNTER — Telehealth: Payer: Self-pay | Admitting: Neurology

## 2015-04-23 NOTE — Telephone Encounter (Signed)
Patient is interested in moving forward with the [name] testing discussed at her last visit however she would like for [place] to check with her insurance company to get it approved before scheduling please advise

## 2015-04-23 NOTE — Telephone Encounter (Signed)
Please give patient Tracey Randolph [phone], option 4 for billing and insurance questions and or [name] test code is 4010 [name] Advanced Evaluation.  She will need to call them directly and find out cost.

## 2015-04-23 NOTE — Telephone Encounter (Signed)
Pt states that she needs to talk to someone about a test please call [phone] she said the test was a TMT

## 2015-04-28 ENCOUNTER — Telehealth: Payer: Self-pay | Admitting: Neurology

## 2015-04-28 NOTE — Telephone Encounter (Signed)
Pt Tracey Randolph, [phone]/called about Warden/ranger

## 2015-04-29 NOTE — Telephone Encounter (Signed)
Called patient back and left message for her to call me.   

## 2015-04-29 NOTE — Telephone Encounter (Signed)
Left message for patient to call me back. 

## 2015-04-29 NOTE — Telephone Encounter (Signed)
Tracey Randolph, Azzaro C Cerutti/DOB: 2066-04-22/ returned your call @ 12:42p/call back @ [phone]

## 2015-04-30 ENCOUNTER — Telehealth: Payer: Self-pay | Admitting: Neurology

## 2015-04-30 NOTE — Telephone Encounter (Signed)
Patient would like genetic testing.  Do you know what code we use for insurance?

## 2015-04-30 NOTE — Telephone Encounter (Signed)
See next note

## 2015-04-30 NOTE — Telephone Encounter (Signed)
Left patient a message giving her the code and instructions to call the [employer] rep.

## 2015-04-30 NOTE — Telephone Encounter (Signed)
Pt returned phone call/call back @ [phone]

## 2015-04-30 NOTE — Telephone Encounter (Signed)
Let's order [employer] test code [phone], again - be sure she is aware of the cost.  Please put her in touch with [employer] representatives who can give her more information.

## 2015-05-06 ENCOUNTER — Telehealth: Payer: Self-pay | Admitting: Neurology

## 2015-05-06 NOTE — Telephone Encounter (Signed)
Called for "CMP" test results/ call back # [phone]

## 2015-05-06 NOTE — Telephone Encounter (Signed)
Patient said that no one has called her yet for the lab test.  I gave her to financial aid # to call.  I had mailed it to her but she said that she did not get it.  Informed her they should call after she speaks with the financial rep.

## 2015-05-07 ENCOUNTER — Telehealth: Payer: Self-pay | Admitting: Neurology

## 2015-05-07 NOTE — Telephone Encounter (Signed)
Pt called about cpt code/mulecular pathology procedure? Call back @ [phone]

## 2015-05-08 NOTE — Telephone Encounter (Signed)
Patient called back asking about test.  Informed her that it is a genetic test that will let us know if she is a carrier.

## 2015-06-05 DIAGNOSIS — G4730 Sleep apnea, unspecified: Secondary | ICD-10-CM

## 2015-06-05 DIAGNOSIS — L501 Idiopathic urticaria: Secondary | ICD-10-CM | POA: Insufficient documentation

## 2015-06-05 DIAGNOSIS — J383 Other diseases of vocal cords: Secondary | ICD-10-CM

## 2015-06-05 DIAGNOSIS — J3089 Other allergic rhinitis: Secondary | ICD-10-CM | POA: Insufficient documentation

## 2015-06-05 DIAGNOSIS — K219 Gastro-esophageal reflux disease without esophagitis: Secondary | ICD-10-CM | POA: Insufficient documentation

## 2015-06-05 DIAGNOSIS — J4540 Moderate persistent asthma, uncomplicated: Secondary | ICD-10-CM | POA: Insufficient documentation

## 2015-06-05 DIAGNOSIS — Z7952 Long term (current) use of systemic steroids: Secondary | ICD-10-CM | POA: Insufficient documentation

## 2015-06-12 ENCOUNTER — Other Ambulatory Visit: Payer: Self-pay | Admitting: *Deleted

## 2015-06-12 MED ORDER — OMALIZUMAB 150 MG ~~LOC~~ SOLR
300.0000 mg | SUBCUTANEOUS | Status: AC
Start: 2015-06-30 — End: ?
  Administered 2015-07-01 – 2015-10-22 (×5): 300 mg via SUBCUTANEOUS

## 2015-06-16 ENCOUNTER — Telehealth: Payer: Self-pay | Admitting: *Deleted

## 2015-06-16 NOTE — Telephone Encounter (Signed)
Not sure which test she is referring to - Morrie Sheldon please advise.

## 2015-06-16 NOTE — Telephone Encounter (Signed)
Patient calling about her lab results Call back number [phone]

## 2015-06-17 ENCOUNTER — Telehealth: Payer: Self-pay | Admitting: Neurology

## 2015-06-17 NOTE — Telephone Encounter (Signed)
See next note

## 2015-06-17 NOTE — Telephone Encounter (Signed)
Please inform patient that her [name] gene evaluation is negative for the most common gene mutations.   [employer] labs 06/15/2017:  GJB1, MFN2, MPZ, PMP22 gene mutations are all negative.  Will be scanned into epic.   [name], DO

## 2015-06-17 NOTE — Telephone Encounter (Signed)
Patient notified

## 2015-06-17 NOTE — Telephone Encounter (Signed)
Pt called and wanted her test results/Dawn CB# [phone]

## 2015-06-17 NOTE — Telephone Encounter (Signed)
Pt called for blood work results/ call back @ [phone]

## 2015-06-29 ENCOUNTER — Encounter: Payer: Self-pay | Admitting: Neurology

## 2015-07-01 ENCOUNTER — Ambulatory Visit (INDEPENDENT_AMBULATORY_CARE_PROVIDER_SITE_OTHER): Payer: 59 | Admitting: *Deleted

## 2015-07-01 DIAGNOSIS — L501 Idiopathic urticaria: Secondary | ICD-10-CM

## 2015-07-29 ENCOUNTER — Other Ambulatory Visit: Payer: Self-pay | Admitting: *Deleted

## 2015-07-29 ENCOUNTER — Ambulatory Visit (INDEPENDENT_AMBULATORY_CARE_PROVIDER_SITE_OTHER): Payer: 59

## 2015-07-29 DIAGNOSIS — L501 Idiopathic urticaria: Secondary | ICD-10-CM | POA: Diagnosis not present

## 2015-07-29 MED ORDER — ALBUTEROL SULFATE HFA 108 (90 BASE) MCG/ACT IN AERS: 2.0000 | INHALATION_SPRAY | RESPIRATORY_TRACT | Status: DC | PRN

## 2015-07-30 NOTE — Telephone Encounter (Signed)
Error

## 2015-08-26 ENCOUNTER — Ambulatory Visit (INDEPENDENT_AMBULATORY_CARE_PROVIDER_SITE_OTHER): Payer: 59 | Admitting: *Deleted

## 2015-08-26 DIAGNOSIS — L501 Idiopathic urticaria: Secondary | ICD-10-CM

## 2015-09-03 HISTORY — PX: HERNIA REPAIR: SHX51

## 2015-09-10 ENCOUNTER — Ambulatory Visit: Payer: 59 | Admitting: Allergy and Immunology

## 2015-09-14 ENCOUNTER — Encounter: Payer: Self-pay | Admitting: Allergy and Immunology

## 2015-09-14 ENCOUNTER — Ambulatory Visit (INDEPENDENT_AMBULATORY_CARE_PROVIDER_SITE_OTHER): Payer: 59 | Admitting: Allergy and Immunology

## 2015-09-14 VITALS — BP 120/86 | HR 110 | Resp 20

## 2015-09-14 DIAGNOSIS — J3089 Other allergic rhinitis: Secondary | ICD-10-CM

## 2015-09-14 DIAGNOSIS — L501 Idiopathic urticaria: Secondary | ICD-10-CM

## 2015-09-14 DIAGNOSIS — G4730 Sleep apnea, unspecified: Secondary | ICD-10-CM

## 2015-09-14 DIAGNOSIS — J4540 Moderate persistent asthma, uncomplicated: Secondary | ICD-10-CM | POA: Diagnosis not present

## 2015-09-14 DIAGNOSIS — J387 Other diseases of larynx: Secondary | ICD-10-CM | POA: Diagnosis not present

## 2015-09-14 DIAGNOSIS — J383 Other diseases of vocal cords: Secondary | ICD-10-CM

## 2015-09-14 DIAGNOSIS — K219 Gastro-esophageal reflux disease without esophagitis: Secondary | ICD-10-CM

## 2015-09-14 DIAGNOSIS — Z7952 Long term (current) use of systemic steroids: Secondary | ICD-10-CM

## 2015-09-14 NOTE — Patient Instructions (Signed)
  1. Continue all medications prescribed by Dr.Chodri including nebulized budesonide twice a day, montelukast 10 mg daily, and DuoNeb up to 2-3 times per day, and including tapering prednisone dose  2. Continue Xolair 300 mg every 4 weeks and EpiPen if needed  3. Continue omeprazole 40 mg twice a day  4. Continue Zyrtec if needed  5. Return to clinic in 12 weeks or earlier if problem  6. Daily steroids?

## 2015-09-14 NOTE — Progress Notes (Signed)
[facility] [place]  Follow-up Note  Refering Provider: Eloisa Northern, MD Primary Provider: Eloisa Northern, MD  Subjective:   Tracey Randolph is a 49 y.o. female who returns to the [facility] in re-evaluation of the following:  HPI Comments:  Tracey Randolph returns to this clinic on 09/14/2015 in reevaluation of her chronic urticaria treated with Xolair, asthma, allergic rhinitis, vocal cord dysfunction, and reflux-induced respiratory disease. She just got discharged from the hospital for an exacerbation of her asthma. It sounds as though she is seen Dr. Rachael Darby monthly since the fall for asthma is been receiving pulses of systemic steroids in the treatment of this condition while she continues to use various antiasthmatic medications. She tells me that she may of had a pneumonia with this most recent exacerbation but I chest x-ray dated on 09/12/2015 which was the day after her admission does not identify any significant abnormality. She is once again put on a prednisone taper and she is now using nebulized budesonide twice a day as well as montelukast. She has had a Nissen fundoplication 2 weeks prior to her exacerbation and is no longer on any ranitidine but does remain on omeprazole twice a day. She thinks that she actually did have some reflux episodes recently. She uses her DuoNeb 2-3 times per day which she thinks does help her somewhat.   Outpatient Encounter Prescriptions as of 09/14/2015  Medication Sig  . albuterol (PROVENTIL HFA;VENTOLIN HFA) 108 (90 BASE) MCG/ACT inhaler Inhale 2 puffs into the lungs every 4 (four) hours as needed for wheezing or shortness of breath.  . ASPIRIN PO Take by mouth.  . Black Cohosh 540 MG CAPS Take 1 tablet by mouth.  . cholecalciferol (VITAMIN D) 1000 UNITS tablet Take 5,000 Units by mouth daily.  Marland Kitchen desvenlafaxine (PRISTIQ) 100 MG 24 hr tablet Take 100 mg by mouth.  . DiphenhydrAMINE HCl  (BENADRYL PO) Take by mouth as needed (itching).   Marland Kitchen FLUoxetine HCl (PROZAC PO) Take by mouth.  . furosemide (LASIX) 20 MG tablet Take 20 mg by mouth daily.  Marland Kitchen ipratropium (ATROVENT) 0.02 % nebulizer solution 500 mcg.  . levofloxacin (LEVAQUIN) 500 MG tablet   . Melatonin 5 MG TABS Take by mouth.  . montelukast (SINGULAIR) 10 MG tablet Take 10 mg by mouth.  . nortriptyline (PAMELOR) 25 MG capsule Take 25 mg by mouth.  Marland Kitchen omeprazole (PRILOSEC) 20 MG capsule Take 20 mg by mouth.  . Oxycodone HCl 10 MG TABS Take 10 mg by mouth every 8 (eight) hours.  . polyethylene glycol powder (GLYCOLAX/MIRALAX) powder Take by mouth.  . predniSONE (DELTASONE) 10 MG tablet Take 10 mg by mouth daily.  . predniSONE (STERAPRED UNI-PAK 48 TAB) 10 MG (48) TBPK tablet   . ranitidine (ZANTAC) 300 MG capsule Take 300 mg by mouth every evening.  . venlafaxine (EFFEXOR) 75 MG tablet Take 150 mg by mouth 2 (two) times daily with a meal.   . Zolpidem Tartrate (AMBIEN PO) Take by mouth.   Facility-Administered Encounter Medications as of 09/14/2015  Medication  . omalizumab Geoffry Paradise) injection 300 mg    No orders of the defined types were placed in this encounter.    Past Medical History  Diagnosis Date  . Fibromyalgia   . Sciatic nerve pain   . Anxiety   . Urticaria   . Hip arthritis   . Respiratory failure [facility])     [facility]  . [facility]     Past Surgical History  Procedure Laterality Date  . Carpal tunnel release Right   . Cesarean section    . Hernia repair  [id number]    Allergies  Allergen Reactions  . Cefuroxime Rash  . Morphine Rash    "Patch form only"    Review of Systems  Constitutional: Negative.   HENT: Negative.   Eyes: Negative.   Respiratory: Positive for cough and shortness of breath.   Cardiovascular: Negative.   Gastrointestinal: Negative.        Reflux  Musculoskeletal: Positive for back pain and arthralgias.  Skin:  Negative.   Hematological: Negative.      Objective:   Filed Vitals:   09/14/15 1645  BP: 120/86  Pulse: 110  Resp: 20          Physical Exam  Constitutional: She appears well-developed and well-nourished. No distress.  HENT:  Head: Normocephalic and atraumatic. Head is without right periorbital erythema and without left periorbital erythema.  Right Ear: Tympanic membrane, external ear and ear canal normal. No drainage or tenderness. No foreign bodies. Tympanic membrane is not injected, not scarred, not perforated, not erythematous, not retracted and not bulging. No middle ear effusion.  Left Ear: Tympanic membrane, external ear and ear canal normal. No drainage or tenderness. No foreign bodies. Tympanic membrane is not injected, not scarred, not perforated, not erythematous, not retracted and not bulging.  No middle ear effusion.  Nose: Nose normal. No mucosal edema, rhinorrhea, nose lacerations or sinus tenderness.  No foreign bodies.  Mouth/Throat: Oropharynx is clear and moist. No oropharyngeal exudate, posterior oropharyngeal edema, posterior oropharyngeal erythema or tonsillar abscesses.  Eyes: Lids are normal. Right eye exhibits no chemosis, no discharge and no exudate. No foreign body present in the right eye. Left eye exhibits no chemosis, no discharge and no exudate. No foreign body present in the left eye. Right conjunctiva is not injected. Left conjunctiva is not injected.  Neck: Neck supple. No tracheal tenderness present. No tracheal deviation and no edema present. No thyroid mass and no thyromegaly present.  Cardiovascular: Normal rate, regular rhythm, S1 normal and S2 normal.  Exam reveals no gallop.   No murmur heard. Pulmonary/Chest: No accessory muscle usage or stridor. No respiratory distress. She has wheezes (end expiratory wheezing in all lung fields). She has no rhonchi. She has no rales.  Abdominal: Soft.  Lymphadenopathy:       Head (right side): No tonsillar  adenopathy present.       Head (left side): No tonsillar adenopathy present.    She has no cervical adenopathy.  Neurological: She is alert.  Skin: No rash noted. She is not diaphoretic.  Psychiatric: She has a normal mood and affect. Her behavior is normal.    Diagnostics:    Spirometry was not performed as per patient's wishes  The patient had an Asthma Control Test with the following results: ACT Total Score: 6.    Assessment and Plan:   1. Moderate persistent asthma, uncomplicated   2. Idiopathic urticaria   3. Other allergic rhinitis   4. Laryngopharyngeal reflux (LPR)   5. Long term current use of systemic steroids   6. Sleep apnea   7. Vocal cord dysfunction      1. Continue all medications prescribed by Dr.Chodri including nebulized budesonide twice a day, montelukast 10 mg daily, and DuoNeb up to 2-3 times per day, and including tapering prednisone dose  2. Continue Xolair 300 mg every 4  weeks and EpiPen if needed  3. Continue omeprazole 40 mg twice a day  4. Continue Zyrtec if needed  5. Return to clinic in 12 weeks or earlier if problem  6. Daily steroids?  Concerning Wynona Canes is chronic urticaria it is under excellent control utilizing her Xolair. Obviously her breathing issue is not under good control and she still requires intermittent use of systemic steroids after history of using prolonged systemic steroids on a daily basis. She does do better on systemic steroids on a daily basis regarding her respiratory tract disease but certainly this is not a very good long-term treatment plan. Hopefully with attention to her reflux now with a Nissen fundoplication we will be removing one of the triggers giving rise to her respiratory tract irritation and inflammation. She is going to follow with Dr. Blenda Nicely regarding the anti-asthmatic medicines. I'll see her back in this clinic in 12 weeks or earlier if there is a problem.       Laurette Schimke, MD [facility]

## 2015-09-18 ENCOUNTER — Ambulatory Visit: Payer: 59 | Admitting: *Deleted

## 2015-09-23 ENCOUNTER — Ambulatory Visit (INDEPENDENT_AMBULATORY_CARE_PROVIDER_SITE_OTHER): Payer: 59 | Admitting: *Deleted

## 2015-09-23 DIAGNOSIS — L501 Idiopathic urticaria: Secondary | ICD-10-CM | POA: Diagnosis not present

## 2015-10-02 ENCOUNTER — Other Ambulatory Visit: Payer: Self-pay | Admitting: Allergy and Immunology

## 2015-10-22 ENCOUNTER — Ambulatory Visit (INDEPENDENT_AMBULATORY_CARE_PROVIDER_SITE_OTHER): Payer: 59 | Admitting: Allergy and Immunology

## 2015-10-22 ENCOUNTER — Encounter: Payer: Self-pay | Admitting: Allergy and Immunology

## 2015-10-22 VITALS — BP 124/92 | HR 76 | Resp 24

## 2015-10-22 DIAGNOSIS — J383 Other diseases of vocal cords: Secondary | ICD-10-CM

## 2015-10-22 DIAGNOSIS — L501 Idiopathic urticaria: Secondary | ICD-10-CM | POA: Diagnosis not present

## 2015-10-22 DIAGNOSIS — J3089 Other allergic rhinitis: Secondary | ICD-10-CM | POA: Diagnosis not present

## 2015-10-22 DIAGNOSIS — J4540 Moderate persistent asthma, uncomplicated: Secondary | ICD-10-CM | POA: Diagnosis not present

## 2015-10-22 DIAGNOSIS — G4730 Sleep apnea, unspecified: Secondary | ICD-10-CM | POA: Diagnosis not present

## 2015-10-22 DIAGNOSIS — Z7952 Long term (current) use of systemic steroids: Secondary | ICD-10-CM

## 2015-10-22 NOTE — Patient Instructions (Addendum)
  1. Continue all medications prescribed by Dr.Chodri including nebulized budesonide twice a day, montelukast 10 mg daily, and DuoNeb up to 2-3 times per day, and including tapering prednisone dose at 30 mg a day for three days, followed by 20 mg a day for three days, followed by 10 mg a day for three days.  2. Continue Xolair 300 mg every 4 weeks and EpiPen if needed  3. Continue omeprazole 40 mg twice a day  4. Continue Zyrtec if needed.  5. Return to clinic in 12 weeks or earlier if problem  6. Evaluation with [facility] and Pulmonology at [facility]

## 2015-10-22 NOTE — Progress Notes (Signed)
[facility] [place]  Follow-up Note  Referring Provider: Eloisa Northern, MD Primary Provider: Eloisa Northern, MD Date of Office Visit: 10/22/2015  Subjective:   Tracey Randolph is a 50 y.o. female who returns to the [facility] Center in re-evaluation of the following:  HPI Comments: Tracey Randolph returns to this clinic on 10/22/2015 in reevaluation of multiple issues.  We see her in his clinic for chronic urticaria treated with Xolair. This is been a successful treatment and basically her chronic urticaria has melted away while using this medication.  She has a history of asthma / COPD - 50-pack-year former smoker followed by a local pulmonologist in town who appears to be treating her with systemic steroids on a common basis. She is apparently been admitted to the hospital multiple times for "respiratory failure". There may be documentation that she actually has had low oxygenation during some of these episodes. She is not responding to very aggressive antiasthmatic therapy including high doses of systemic steroids and what appears to be nebulized bronchodilator and steroids. She had a Nissen fundoplication in December to treat reflux with consideration that this may be responsible for some of her asthma flares. Unfortunately, she ended up in [facility] once again in January with a asthma flare and was treated with steroids. The past 3 days she's developed problems with shortness of breath and coughing and wheezing and she self administered 40 mg a steroids for the past 3 days.  There is always been the question of vocal cord dysfunction revolving around [name]'s situation. She does have a fair amount of throat issues. She has throat tightness and sometimes feels as though air can't past through her throat and with her reflux she had lots of throat clearing and occasionally globs stuck in her throat.  She also has a history of allergic  rhinitis which is not a particularly big issue at this point in time and does not really require any specific therapy. And she has a history of sleep apnea for which he intermittently uses a CPAP machine.   Current Outpatient Prescriptions on File Prior to Visit  Medication Sig Dispense Refill  . ASPIRIN PO Take by mouth.    . Black Cohosh 540 MG CAPS Take 1 tablet by mouth.    . cholecalciferol (VITAMIN D) 1000 UNITS tablet Take 5,000 Units by mouth daily.    Marland Kitchen desvenlafaxine (PRISTIQ) 100 MG 24 hr tablet Take 100 mg by mouth.    . DiphenhydrAMINE HCl (BENADRYL PO) Take by mouth as needed (itching).     Marland Kitchen FLUoxetine HCl (PROZAC PO) Take by mouth.    . furosemide (LASIX) 20 MG tablet Take 20 mg by mouth daily.  3  . ipratropium (ATROVENT) 0.02 % nebulizer solution 500 mcg.    . levofloxacin (LEVAQUIN) 500 MG tablet     . Melatonin 5 MG TABS Take by mouth.    . montelukast (SINGULAIR) 10 MG tablet Take 10 mg by mouth.    . nortriptyline (PAMELOR) 25 MG capsule Take 25 mg by mouth.    Marland Kitchen omeprazole (PRILOSEC) 20 MG capsule Take 20 mg by mouth.    . Oxycodone HCl 10 MG TABS Take 10 mg by mouth every 8 (eight) hours.  0  . polyethylene glycol powder (GLYCOLAX/MIRALAX) powder Take by mouth.    . predniSONE (DELTASONE) 10 MG tablet Take 10 mg by mouth daily.  3  . predniSONE (STERAPRED UNI-PAK 48 TAB) 10 MG (  48) TBPK tablet     . PROAIR HFA 108 (90 Base) MCG/ACT inhaler INHALE 2 PUFFS INTO THE LUNGS EVERY 4 (FOUR) HOURS AS NEEDED FOR WHEEZING OR SHORTNESS OF BREATH. 8.5 Inhaler 1  . ranitidine (ZANTAC) 300 MG capsule Take 300 mg by mouth every evening.    . venlafaxine (EFFEXOR) 75 MG tablet Take 150 mg by mouth 2 (two) times daily with a meal.     . Zolpidem Tartrate (AMBIEN PO) Take by mouth.     Current Facility-Administered Medications on File Prior to Visit  Medication Dose Route Frequency Provider Last Rate Last Dose  . omalizumab Geoffry Paradise) injection 300 mg  300 mg Subcutaneous Q28 days  Jessica Priest, MD   300 mg at 10/22/15 1631    No orders of the defined types were placed in this encounter.    Past Medical History  Diagnosis Date  . Fibromyalgia   . Sciatic nerve pain   . Anxiety   . Urticaria   . Hip arthritis   . Respiratory failure [facility])     [facility]  . Stress-induced cardiomyopathy     [facility]     Past Surgical History  Procedure Laterality Date  . Carpal tunnel release Right   . Cesarean section    . Hernia repair  [id number]    Allergies  Allergen Reactions  . Cefuroxime Rash  . Morphine Rash    "Patch form only"    Review of systems negative except as noted in HPI / PMHx or noted below:  ROS   Objective:   Filed Vitals:   10/22/15 1628  BP: 124/92  Pulse: 76  Resp: 24          Physical Exam  Constitutional: She is well-developed, well-nourished, and in no distress. No distress.  HENT:  Head: Normocephalic.  Right Ear: Tympanic membrane, external ear and ear canal normal.  Left Ear: Tympanic membrane, external ear and ear canal normal.  Nose: Nose normal. No mucosal edema or rhinorrhea.  Mouth/Throat: Uvula is midline, oropharynx is clear and moist and mucous membranes are normal. No oropharyngeal exudate.  Eyes: Conjunctivae are normal.  Neck: Trachea normal. No tracheal tenderness present. No tracheal deviation present. No thyromegaly present.  Cardiovascular: Normal rate, regular rhythm, S1 normal, S2 normal and normal heart sounds.   No murmur heard. Pulmonary/Chest: No stridor. No respiratory distress. She has wheezes (Expiratory wheezing transmitted from throat). She has no rales.  Musculoskeletal: She exhibits no edema.  Lymphadenopathy:       Head (right side): No tonsillar adenopathy present.       Head (left side): No tonsillar adenopathy present.    She has no cervical adenopathy.    She has no axillary adenopathy.  Neurological: She is alert. Gait normal.  Skin: No rash noted. She is not  diaphoretic. No erythema. Nails show no clubbing.  Psychiatric: Mood and affect normal.    Diagnostics:    Spirometry was not performed as per patient's wishes   Oxygen saturation was 97% room air at rest  The patient had an Asthma Control Test with the following results:  .    Assessment and Plan:   1. Moderate persistent asthma, uncomplicated   2. Long term current use of systemic steroids   3. Vocal cord dysfunction   4. Other allergic rhinitis   5. Idiopathic urticaria   6. Sleep apnea      1. Continue all medications prescribed by Dr.Chodri including nebulized budesonide twice  a day, montelukast 10 mg daily, and DuoNeb up to 2-3 times per day, and including tapering prednisone dose at 30 mg a day for three days, followed by 20 mg a day for three days, followed by 10 mg a day for three days.  2. Continue Xolair 300 mg every 4 weeks and EpiPen if needed  3. Continue omeprazole 40 mg twice a day  4. Continue Zyrtec if needed.  5. Return to clinic in 12 weeks or earlier if problem  6. Evaluation with [facility] and Pulmonology at [facility] is not doing well with her current medical therapy with a medical approach is being recommended by our local physicians and I think she needs to be evaluated further for significant pulmonary dysfunction and vocal cord dysfunction at [facility] by the visiting both the pulmonary Department and voice disorder Center. We will get that arranged as soon as possible. We'll taper her off her current dose of systemic steroids as noted above. She can continue to use antiasthmatic therapy as prescribed by Dr. Blenda Nicely at this point in time.     Laurette Schimke, MD [facility]

## 2015-11-16 ENCOUNTER — Ambulatory Visit: Payer: Self-pay | Admitting: *Deleted

## 2015-12-10 ENCOUNTER — Ambulatory Visit: Payer: Self-pay | Admitting: Allergy and Immunology

## 2015-12-28 ENCOUNTER — Encounter: Payer: Self-pay | Admitting: Podiatry

## 2015-12-28 ENCOUNTER — Other Ambulatory Visit: Payer: Self-pay | Admitting: Podiatry

## 2015-12-28 ENCOUNTER — Ambulatory Visit (INDEPENDENT_AMBULATORY_CARE_PROVIDER_SITE_OTHER): Payer: 59 | Admitting: Podiatry

## 2015-12-28 ENCOUNTER — Ambulatory Visit (INDEPENDENT_AMBULATORY_CARE_PROVIDER_SITE_OTHER): Payer: 59

## 2015-12-28 VITALS — BP 107/69 | HR 92 | Resp 14

## 2015-12-28 DIAGNOSIS — R52 Pain, unspecified: Secondary | ICD-10-CM

## 2015-12-28 DIAGNOSIS — M129 Arthropathy, unspecified: Secondary | ICD-10-CM | POA: Diagnosis not present

## 2015-12-28 DIAGNOSIS — M6281 Muscle weakness (generalized): Secondary | ICD-10-CM

## 2015-12-28 DIAGNOSIS — M19079 Primary osteoarthritis, unspecified ankle and foot: Secondary | ICD-10-CM

## 2015-12-28 MED ORDER — OXAPROZIN 600 MG PO TABS: 600.0000 mg | ORAL_TABLET | Freq: Two times a day (BID) | ORAL | Status: DC

## 2015-12-28 NOTE — Progress Notes (Signed)
   Subjective:    Patient ID: Tracey Randolph, female    DOB: 10/10/1965, 50 y.o.   MRN: [id number]  HPI this patient presents to the office today with chief complaint of a painful right ankle. She says that she has been experiencing pain for the last 2-3 months. She says that her ankle appears to return to the right when she walks. She says that the pain is increasing related to her activity. She denies being diabetic, but does admit to having neuropathy. She has no history of trauma or injury to the right ankle recently. She does give a history of having injured her right foot  years ago and was told that she had a bent bone in her right foot. She is concerned about loosing her balance through her right ankle/foot. She presents the office today for an evaluation and treatment of this condition The patient presents here today with right foot,ankle  pain.   Review of Systems  All other systems reviewed and are negative.      Objective:   Physical Exam GENERAL APPEARANCE: Alert, conversant. Appropriately groomed. No acute distress.  VASCULAR: Pedal pulses are  palpable at  [facility] and PT bilateral.  Capillary refill time is immediate to all digits,  Normal temperature gradient.  Digital hair growth is present bilateral  NEUROLOGIC: sensation is normal to 5.07 monofilament at 5/5 sites bilateral.  Light touch is intact bilateral, Plantarflexion, dorsiflexion inversion and eversion in right leg is absent.  KJ and AJ are absent bilateral.  MUSCULOSKELETAL: .  Intrinsic muscluature intact bilateral.  Rectus appearance of foot and digits noted bilateral. Palpable pain along the anterior aspect right ankle.  She has pain along the distal aspect tibia.  Pain along distal lateral malleolus.  Pain along the course of the peroneal tendon right foot.  Pain along the outside top right foot.  No swelling or increased temperature right foot or ankle.  DERMATOLOGIC: skin color, texture, and turgor are within normal  limits.  No preulcerative lesions or ulcers  are seen, no interdigital maceration noted.  No open lesions present.  Digital nails are asymptomatic. No drainage noted.         Assessment & Plan:  Ankle pain right ,  Muscle weakenss secondary to unknown nerve disease  IE.  Xray of foot unremarkable.  Ankle xray unremarkable. Dispensed elastic sock right ankle.  Prescribe Mobic. Patient   assured me that this would not be beneficial.  At this time [facility] for her to wear.   Helane GuntherGregory Bonnye Halle DPM

## 2015-12-30 ENCOUNTER — Telehealth: Payer: Self-pay | Admitting: Allergy and Immunology

## 2015-12-30 NOTE — Telephone Encounter (Signed)
CONE IS LOOKING INTO THIS MATTER

## 2015-12-30 NOTE — Telephone Encounter (Signed)
She has a question about a CPT code [id number], wants to know why she is being charged for chemotherapy?

## 2016-01-05 NOTE — Telephone Encounter (Signed)
xolaire issues - working with Tammy to figure out what to do

## 2016-01-13 ENCOUNTER — Other Ambulatory Visit: Payer: 59

## 2016-01-25 NOTE — Telephone Encounter (Signed)
PT IS REQUESTING AN ITEMIZED RECEIPT OF HER CHARGES AND PAYMENTS. ADDRESS IS [id number] [place] [id number]

## 2016-01-26 NOTE — Telephone Encounter (Signed)
MADE IN ERROR °

## 2016-08-30 ENCOUNTER — Telehealth: Payer: Self-pay | Admitting: Neurology

## 2016-08-30 NOTE — Telephone Encounter (Signed)
Copy mailed to patient.

## 2016-08-30 NOTE — Telephone Encounter (Signed)
Daneen SchickChristina Zirbel 02/14/1966. Her # [phone]. She said she is needing a copy of her [name] report? She would like that to be sent to her. Thank you

## 2016-10-03 DIAGNOSIS — J4520 Mild intermittent asthma, uncomplicated: Secondary | ICD-10-CM | POA: Diagnosis not present

## 2016-10-03 DIAGNOSIS — J449 Chronic obstructive pulmonary disease, unspecified: Secondary | ICD-10-CM | POA: Diagnosis not present

## 2016-10-03 DIAGNOSIS — G4733 Obstructive sleep apnea (adult) (pediatric): Secondary | ICD-10-CM | POA: Diagnosis not present

## 2016-10-05 DIAGNOSIS — G8929 Other chronic pain: Secondary | ICD-10-CM | POA: Diagnosis not present

## 2016-10-05 DIAGNOSIS — M797 Fibromyalgia: Secondary | ICD-10-CM | POA: Diagnosis not present

## 2016-10-05 DIAGNOSIS — F410 Panic disorder [episodic paroxysmal anxiety] without agoraphobia: Secondary | ICD-10-CM | POA: Diagnosis not present

## 2016-10-05 DIAGNOSIS — G894 Chronic pain syndrome: Secondary | ICD-10-CM | POA: Diagnosis not present

## 2016-10-05 DIAGNOSIS — M5412 Radiculopathy, cervical region: Secondary | ICD-10-CM | POA: Diagnosis not present

## 2016-10-05 DIAGNOSIS — T7840XA Allergy, unspecified, initial encounter: Secondary | ICD-10-CM | POA: Diagnosis not present

## 2016-10-05 DIAGNOSIS — M542 Cervicalgia: Secondary | ICD-10-CM | POA: Diagnosis not present

## 2016-10-11 DIAGNOSIS — F410 Panic disorder [episodic paroxysmal anxiety] without agoraphobia: Secondary | ICD-10-CM | POA: Diagnosis not present

## 2016-10-11 DIAGNOSIS — T7840XA Allergy, unspecified, initial encounter: Secondary | ICD-10-CM | POA: Diagnosis not present

## 2016-10-11 DIAGNOSIS — M797 Fibromyalgia: Secondary | ICD-10-CM | POA: Diagnosis not present

## 2016-10-14 DIAGNOSIS — J449 Chronic obstructive pulmonary disease, unspecified: Secondary | ICD-10-CM | POA: Diagnosis not present

## 2016-10-14 DIAGNOSIS — J4541 Moderate persistent asthma with (acute) exacerbation: Secondary | ICD-10-CM | POA: Diagnosis not present

## 2016-10-14 DIAGNOSIS — M797 Fibromyalgia: Secondary | ICD-10-CM | POA: Diagnosis not present

## 2016-10-20 DIAGNOSIS — G4733 Obstructive sleep apnea (adult) (pediatric): Secondary | ICD-10-CM | POA: Diagnosis not present

## 2016-10-20 DIAGNOSIS — J449 Chronic obstructive pulmonary disease, unspecified: Secondary | ICD-10-CM | POA: Diagnosis not present

## 2016-10-20 DIAGNOSIS — J4520 Mild intermittent asthma, uncomplicated: Secondary | ICD-10-CM | POA: Diagnosis not present

## 2016-10-21 DIAGNOSIS — F419 Anxiety disorder, unspecified: Secondary | ICD-10-CM | POA: Diagnosis not present

## 2016-10-24 DIAGNOSIS — R51 Headache: Secondary | ICD-10-CM | POA: Diagnosis not present

## 2016-10-24 DIAGNOSIS — Z5321 Procedure and treatment not carried out due to patient leaving prior to being seen by health care provider: Secondary | ICD-10-CM | POA: Diagnosis not present

## 2016-10-26 DIAGNOSIS — M5412 Radiculopathy, cervical region: Secondary | ICD-10-CM | POA: Diagnosis not present

## 2016-10-26 DIAGNOSIS — M542 Cervicalgia: Secondary | ICD-10-CM | POA: Diagnosis not present

## 2016-10-26 DIAGNOSIS — G894 Chronic pain syndrome: Secondary | ICD-10-CM | POA: Diagnosis not present

## 2016-10-26 DIAGNOSIS — G8929 Other chronic pain: Secondary | ICD-10-CM | POA: Diagnosis not present

## 2016-10-28 DIAGNOSIS — M797 Fibromyalgia: Secondary | ICD-10-CM | POA: Diagnosis not present

## 2016-10-31 DIAGNOSIS — J441 Chronic obstructive pulmonary disease with (acute) exacerbation: Secondary | ICD-10-CM | POA: Diagnosis not present

## 2016-11-01 DIAGNOSIS — J4540 Moderate persistent asthma, uncomplicated: Secondary | ICD-10-CM | POA: Diagnosis not present

## 2016-11-01 DIAGNOSIS — R5383 Other fatigue: Secondary | ICD-10-CM | POA: Diagnosis not present

## 2016-11-02 DIAGNOSIS — M5440 Lumbago with sciatica, unspecified side: Secondary | ICD-10-CM | POA: Diagnosis not present

## 2016-11-02 DIAGNOSIS — G894 Chronic pain syndrome: Secondary | ICD-10-CM | POA: Diagnosis not present

## 2016-11-02 DIAGNOSIS — G8929 Other chronic pain: Secondary | ICD-10-CM | POA: Diagnosis not present

## 2016-11-02 DIAGNOSIS — M542 Cervicalgia: Secondary | ICD-10-CM | POA: Diagnosis not present

## 2016-11-11 DIAGNOSIS — F418 Other specified anxiety disorders: Secondary | ICD-10-CM | POA: Diagnosis not present

## 2016-11-21 DIAGNOSIS — R05 Cough: Secondary | ICD-10-CM | POA: Diagnosis not present

## 2016-11-30 DIAGNOSIS — J449 Chronic obstructive pulmonary disease, unspecified: Secondary | ICD-10-CM | POA: Diagnosis not present

## 2016-12-13 DIAGNOSIS — M4692 Unspecified inflammatory spondylopathy, cervical region: Secondary | ICD-10-CM | POA: Diagnosis not present

## 2016-12-13 DIAGNOSIS — G894 Chronic pain syndrome: Secondary | ICD-10-CM | POA: Diagnosis not present

## 2016-12-13 DIAGNOSIS — M542 Cervicalgia: Secondary | ICD-10-CM | POA: Diagnosis not present

## 2016-12-13 DIAGNOSIS — F419 Anxiety disorder, unspecified: Secondary | ICD-10-CM | POA: Diagnosis not present

## 2016-12-13 DIAGNOSIS — M4306 Spondylolysis, lumbar region: Secondary | ICD-10-CM | POA: Diagnosis not present

## 2016-12-14 ENCOUNTER — Ambulatory Visit (INDEPENDENT_AMBULATORY_CARE_PROVIDER_SITE_OTHER): Payer: 59 | Admitting: Neurology

## 2016-12-14 ENCOUNTER — Encounter: Payer: Self-pay | Admitting: Neurology

## 2016-12-14 VITALS — BP 140/90 | HR 88 | Ht 63.0 in | Wt 112.4 lb

## 2016-12-14 DIAGNOSIS — G894 Chronic pain syndrome: Secondary | ICD-10-CM | POA: Diagnosis not present

## 2016-12-14 DIAGNOSIS — R269 Unspecified abnormalities of gait and mobility: Secondary | ICD-10-CM

## 2016-12-14 DIAGNOSIS — R202 Paresthesia of skin: Secondary | ICD-10-CM

## 2016-12-14 NOTE — Progress Notes (Addendum)
Follow-up Visit   Date: 12/14/16    Tracey MarketChristina C Aronoff MRN: [id number] DOB: 11/06/1965   Interim History: Tracey Randolph is a 51 y.o. ight-handed Caucasian female with anxiety, fibromyalgia, chronic pain, stress-induced cardiomyopathy, right CTS release (1992), and panic attacks returning to the clinic for follow-up of generalized pain and paresthesias.  The patient was accompanied to the clinic by husband who also provides collateral information.  She was last seen in March 2016 for a constellation of symptoms including paresthesias and whole-body pain.  History of present illness: Initial visit 12/18/2014 for opinion on [name]:  Starting around 2013, she reports having numbness or tingling of the arm and feet.  Over the years, she feels that her entire arm hurts, pain feels like a dull throbbing.  She does not have any sharp shooting pain down her arms.  Numbness/tingling involves her entire upper extremity (shoulder, upper arm, forearm, hands), groin, tail bone, and entire leg (thigh, legs, feet).  She feels that her toes are curling under.  She wears heavy socks, because her feet feel as if she is in ice water.  Walking makes it worse.  Nothing that alleviates her pain.   She was having severe back spasms, previously was active and now has difficulty walking even 6 feet.  She walks with a cane and walker for the past two years.  She has fallen about 5-6 times last year, and fortunately has not had any severe injuries.  Patient has previously seen a number of providers including four neurologists, three rheumatologists, neurosurgeon, and Location managerorthopeadic surgeon.  I am the fifth neurologist she is seeing. She also sees pain management, Dr. Anette Riedelarmen Mayo in [place], and has a psychiatrist that she sees.  Patient has a number of chronic pain symptoms and carries a diagnosis of fibromyalgia. She has previously tried Neurontin, Lyrica (started seeing snakes) and Cymbalta which did not help. She  currently takes oxycodone which provides some relief.  [facility] where she was first seen by Dr. Hosie PoissonSumner in September 2014 for whole-body pain, gait instability, and muscle spasms.  Exam was notable for functional qualities.  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  She saw Dr. Conchita ParisNundkumar who agreed that low lying cerebellar tonsil are incidental and unrelated to symptoms and did not recommend any surgical intervention, but per notes, patient did not agree.  Her care was transitioned to Dr. Lucia GaskinsAhern in February 2016, whose clinic note was reviewed.  Patient was very concerned about [name] a her brother passed away with it and she feels as if she has the same symptoms.  She had many other non-neurological complaints at that visit. Per patient, her brother was diagnosed with [name] by a neurologist, she does not think he had genetic testing. There is no other family history of any neuromuscular disease.  She is referred to our clinic for further evaluation for [name].  Prior to this, she saw Dr. 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   UPDATE 12/14/2016:  Patient presents today with complaints of worsening generalized pain involving her neck, back, hands, arms, legs, and feet.  Pain is shooting and debilitating.  Pain is the same that she has been experiencing for the past several years, only more intense.  She continues to stumble and fall at times because her left leg/knee  gives out.  She walks with a cane.  She is unable to participate in physical therapy because of the severity of her pain. She continues to see pain management and has been tried on a number of medications, most recently on Nuycenta but she does not have any relief.  She comes today because she wants to be "fixed" and wants a MRI of  her back.  She is still seeing psychiatrist in [facility], but is not on any medications currently.     Medications:  Current Outpatient Prescriptions on File Prior to Visit  Medication Sig Dispense Refill  . albuterol (PROVENTIL) (2.5 MG/3ML) 0.083% nebulizer solution Take 2.5 mg by nebulization every 6 (six) hours as needed for wheezing or shortness of breath.    . ASPIRIN PO Take by mouth.    . LamoTRIgine 50 MG TBDP Take by mouth.    . Linaclotide (LINZESS) 290 MCG CAPS capsule Take 290 mcg by mouth daily.    . montelukast (SINGULAIR) 10 MG tablet Take 10 mg by mouth.    Marland Kitchen PROAIR HFA 108 (90 Base) MCG/ACT inhaler INHALE 2 PUFFS INTO THE LUNGS EVERY 4 (FOUR) HOURS AS NEEDED FOR WHEEZING OR SHORTNESS OF BREATH. 8.5 Inhaler 1  . Zolpidem Tartrate (AMBIEN PO) Take by mouth.     Current Facility-Administered Medications on File Prior to Visit  Medication Dose Route Frequency Provider Last Rate Last Dose  . omalizumab Geoffry Paradise) injection 300 mg  300 mg Subcutaneous Q28 days Jessica Priest, MD   300 mg at 10/22/15 1631    Allergies:  Allergies  Allergen Reactions  . Dulera [Mometasone Furo-Formoterol Fum]   . Morphine Sulfate   . Cefuroxime Rash  . Morphine Rash    "Patch form only"    Review of Systems:  CONSTITUTIONAL: No fevers, chills, night sweats, or weight loss.  EYES: No visual changes or eye pain ENT: No hearing changes.  No history of nose bleeds.   RESPIRATORY: No cough, wheezing and shortness of breath.   CARDIOVASCULAR: Negative for chest pain, and palpitations.   GI: Negative for abdominal discomfort, blood in stools or black stools.  No recent change in bowel habits.   GU:  No history of incontinence.   MUSCLOSKELETAL: ++history of joint pain or swelling.  ++myalgias.   SKIN: Negative for lesions, rash, and itching.   ENDOCRINE: Negative for cold or heat intolerance, polydipsia or goiter.   PSYCH:  + depression or anxiety symptoms.   NEURO: As Above.   Vital  Signs:  BP 140/90   Pulse 88   Ht 5\' 3"  (1.6 m)   Wt 112 lb 7 oz (51 kg)   SpO2 97%   BMI 19.92 kg/m   Neurological Exam: She is awake and oriented, anxious appearing.   Cranial nerve testing shows normal fundoscopic exam.  Pupils are round and slowly reactive to light.  Extraocular muscles are intact, except mild upgaze paresis bilaterally.  Face is symmetric and sensation intact.  Palate elevates symmetrically and tongue is midline.   Patient does not give full effort on her motor testing and therefore accurate assessment of underlying weakness is very difficult.  Despite encouraging her multiple times, there is still give-way weakness throughout.  She has "shakiness" of the hands and upper extremities which is not typical for tremor and morso manifestation of anxiety as it is easily distractible. She is antigravity in all extremities and able to walk.  Reflexes are 2+/4 in the upper extremities and trace in  the lower extremities.  Sensation is reduced distal to mid-calf bilaterally to pin prick, temperature, and vibration is reduced only at the great toe.  Finger to nose testing is normal.  There is no bradykinesia.  Gait was tested unassisted and is very slow and cautious, with stooped posture.    Data: EMG of the lower extremities 08/28/2012:  Reduced tibial motor amplitudes and increased insertional activity in the sacral paraspinal muscles.  Sural sensory responses were normal.     Labs 11/05/2014:  Vitamin B12 481, folate >20.0, cryoglobulin negative, SPEP/UPEP with IFE no M protein, RPR neg, HIV neg, Hepatitis C, thiamine 207, HbA1c 5.5, CMP nml  MRI brain wwo contrast 07/05/2013:  Normal, incidental finding of low lying cerebellar tonsils MRI thoracic spine wo contrast 07/23/2012:  Normal thoracic spine.  Mild, noncompressive disc bulge at T3-4. MRI lumbar spine wo contrast 06/14/2012:  Normal   [facility] Advanced Evaluation for [name] 05/14/2015:   Negative  IMPRESSION/PLAN: Ms. Contee is a 51 year-old female returning with multiple chronic pain complaints involving her neck, back, arms, legs, and feet.  This has been previously evaluated by a number of providers (four neurologists, three rheumatologists, neurosurgeon, and orthopeadic surgeon) with no identifiable cause for her widespread pain and she has been seeing pain management for chronic pain syndrome. At her last visit here, she was concerned about hereditary neuropathy because of her brother having Charcot Marie Tooth.  She had genetic testing for [name] which returned normal.   She has had an extensive neurological work-up previously which included MRI brain, thoracic spine, NCS/EMG, and lumbar spine.  Low lying cerebellar tonsils are likely incidental and unrelated to her wide-spread pain.  There was no nerve impingement on her thoracic or lumbar spine imaging.  NCS/EMG from 2013 showed normal sural response with low tibial response.    The distribution of her pain and paresthesias does not conform to a typical pattern of neuropathy and her pain is out of portion to her exam. Further, there is nonphysiologic qualities to her clinical examination making accurate assessment of her motor strength difficult. Because reflexes in the legs are reduced and she has sensory complains, I recommended repeat NCS/EMG to evaluate for neuropathy.  Although, even if she did have neuropathy, it would not explain all of her pain.  Patient specifically tells me that she wants a MRI lumbar spine WITH contrast.  I explained that based on her exam findings and symptoms, and the fact that she had normal MRI lumbar spine in 2013, I do not see this to be indicated.  She is very adamant that this be ordered.  MRI lumbar spine wwo contrast will be ordered but I find it highly unlikely this will be approved.    I do feel that patient has lack of confidence in my clinical judgement and she is welcome to seek another opinion.    The duration of this appointment visit was 30 minutes of face-to-face time with the patient.  Greater than 50% of this time was spent in counseling, explanation of diagnosis, planning of further management, and coordination of care.   Thank you for allowing me to participate in patient's care.  If I can answer any additional questions, I would be pleased to do so.    Sincerely,    Jamarious Febo K. Allena Katz, DO

## 2016-12-14 NOTE — Patient Instructions (Addendum)
1.  MRI lumbar spine without contrast 2.  NCS/EMG of the right side

## 2016-12-20 ENCOUNTER — Ambulatory Visit: Payer: Medicare Other | Admitting: Neurology

## 2016-12-20 DIAGNOSIS — M5417 Radiculopathy, lumbosacral region: Secondary | ICD-10-CM

## 2016-12-20 NOTE — Procedures (Signed)
[facility]  [address] [place], [address]  [place], [place] [address] Tel: [phone] Fax:  [phone] Test Date:  12/20/2016  Patient: Tracey Randolph DOB: [birth year] Physician: Nita Sickle, DO  Sex: Female Height: 5\' 3"  Ref Phys: Nita Sickle, DO  ID#: [id number] Temp: 32.0C Technician:    Patient Complaints: This is a 51 year-old female referred for evaluation of whole body pain and paresthesias.  NCV & EMG Findings: Extensive electrodiagnostic testing of the right upper and lower extremity shows:  1. Right median sensory response shows reduced amplitude (10.5 V).  The right ulnar sensory response is within normal limits. 2. Right median and ulnar motor responses are within normal limits. 3. Right sural and superficial peroneal sensory responses are within normal limits. 4. Right peroneal (EDB) and tibial motor responses are absent.  Right peroneal motor response at the tibialis anterior is within normal limits. 5. Chronic motor axon loss changes isolated to the flexor digitorum longus and tibialis anterior muscles, without accompanied active denervation.  Impression: 1. Chronic L5 radiculopathy affecting the right lower extremity, mild in degree electrically. 2. There is evidence of a previously treated right carpal tunnel syndrome. 3. There is no evidence of a large fiber generalized sensorimotor polyneuropathy, diffuse myopathy, or cervical radiculopathy affecting the right side.   ___________________________ Nita Sickle, DO    Nerve Conduction Studies Anti Sensory Summary Table   Stim Site NR Peak (ms) Norm Peak (ms) P-T Amp (V) Norm P-T Amp  Right Median Anti Sensory (2nd Digit)  Wrist    3.6 <3.6 10.5 >15  Right Sup Peroneal Anti Sensory (Ant Lat Mall)  12 cm    2.8 <4.6 8.9 >4  Right Sural Anti Sensory (Lat Mall)  Calf    4.6 <4.6 11.1 >4  Right Ulnar Anti Sensory (5th Digit)  Wrist    3.1 <3.1 14.6 >10   Motor Summary Table   Stim Site NR  Onset (ms) Norm Onset (ms) O-P Amp (mV) Norm O-P Amp Site1 Site2 Delta-0 (ms) Dist (cm) Vel (m/s) Norm Vel (m/s)  Right Median Motor (Abd Poll Brev)  Wrist    3.1 <4.0 7.0 >6 Elbow Wrist 5.1 27.0 53 >50  Elbow    8.2  7.0         Right Peroneal Motor (Ext Dig Brev)  Ankle NR  <6.0  >2.5 B Fib Ankle  0.0  >40  B Fib NR     Poplt B Fib  0.0  >40  Poplt NR            Right Peroneal TA Motor (Tib Ant)  Fib Head    4.5 <4.5 4.2 >3 Poplit Fib Head 0.9 8.0 89 >40  Poplit    5.4  3.8         Right Tibial Motor (Abd Hall Brev)  Ankle NR  <6.0  >4 Knee Ankle  0.0  >40  Knee NR            Right Ulnar Motor (Abd Dig Minimi)  Wrist    2.7 <3.1 12.5 >7 B Elbow Wrist 3.8 21.0 55 >50  B Elbow    6.5  12.2  A Elbow B Elbow 1.7 10.0 59 >50  A Elbow    8.2  12.0          H Reflex Studies   NR H-Lat (ms) Lat Norm (ms) L-R H-Lat (ms)  Right Tibial (Gastroc)     39.46 <35    EMG  Side Muscle Ins Act Fibs Psw Fasc Number Recrt Dur Dur. Amp Amp. Poly Poly. Comment  Right 1stDorInt Nml Nml Nml Nml Nml Nml Nml Nml Nml Nml Nml Nml N/A  Right Abd Poll Brev Nml Nml Nml Nml Nml Nml Nml Nml Nml Nml Nml Nml N/A  Right Ext Indicis Nml Nml Nml Nml Nml Nml Nml Nml Nml Nml Nml Nml N/A  Right PronatorTeres Nml Nml Nml Nml Nml Nml Nml Nml Nml Nml Nml Nml N/A  Right Biceps Nml Nml Nml Nml Nml Nml Nml Nml Nml Nml Nml Nml N/A  Right Triceps Nml Nml Nml Nml Nml Nml Nml Nml Nml Nml Nml Nml N/A  Right Deltoid Nml Nml Nml Nml Nml Nml Nml Nml Nml Nml Nml Nml N/A  Right AntTibialis Nml Nml Nml Nml 1- Rapid Some 1+ Some 1+ Nml Nml N/A  Right Gastroc Nml Nml Nml Nml Nml Nml Nml Nml Nml Nml Nml Nml N/A  Right Flex Dig Long Nml Nml Nml Nml 1- Rapid Some 1+ Some 1+ Nml Nml N/A  Right RectFemoris Nml Nml Nml Nml Nml Nml Nml Nml Nml Nml Nml Nml N/A  Right GluteusMed Nml Nml Nml Nml Nml Nml Nml Nml Nml Nml Nml Nml N/A  Right BicepsFemS Nml Nml Nml Nml Nml Nml Nml Nml Nml Nml Nml Nml N/A      Waveforms:

## 2016-12-22 NOTE — Progress Notes (Signed)
I spoke with Elease HashimotoPatricia at [facility] and she took the MRI brain out of the system.

## 2016-12-30 ENCOUNTER — Other Ambulatory Visit: Payer: Medicare Other

## 2016-12-30 ENCOUNTER — Ambulatory Visit
Admission: RE | Admit: 2016-12-30 | Discharge: 2016-12-30 | Disposition: A | Discharge status: Home / Self Care | Payer: 59 | Source: Ambulatory Visit | Attending: Neurology | Admitting: Neurology

## 2016-12-30 DIAGNOSIS — G894 Chronic pain syndrome: Secondary | ICD-10-CM

## 2016-12-30 MED ORDER — GADOBENATE DIMEGLUMINE 529 MG/ML IV SOLN
10.0000 mL | Freq: Once | INTRAVENOUS | Status: AC | PRN
  Administered 2016-12-30: 10 mL via INTRAVENOUS

## 2017-01-01 DIAGNOSIS — J449 Chronic obstructive pulmonary disease, unspecified: Secondary | ICD-10-CM | POA: Diagnosis not present

## 2017-01-01 DIAGNOSIS — J4520 Mild intermittent asthma, uncomplicated: Secondary | ICD-10-CM | POA: Diagnosis not present

## 2017-01-01 DIAGNOSIS — G4733 Obstructive sleep apnea (adult) (pediatric): Secondary | ICD-10-CM | POA: Diagnosis not present

## 2017-01-02 ENCOUNTER — Telehealth: Payer: Self-pay | Admitting: *Deleted

## 2017-01-02 NOTE — Telephone Encounter (Signed)
Patient given results and instructions and agreed with plan.  

## 2017-01-02 NOTE — Telephone Encounter (Signed)
-----   Message from Glendale Chard, DO sent at 01/02/2017  8:07 AM EDT ----- Please inform patient that there is no nerve impingement on her MRI lumbar spine, very mild age-related changes.  She needs to continue care with pain management.  There is nothing from a neurological standpoint for me to offer.

## 2017-01-08 DIAGNOSIS — R079 Chest pain, unspecified: Secondary | ICD-10-CM | POA: Diagnosis not present

## 2017-01-08 DIAGNOSIS — R0602 Shortness of breath: Secondary | ICD-10-CM | POA: Diagnosis not present

## 2017-01-08 DIAGNOSIS — J449 Chronic obstructive pulmonary disease, unspecified: Secondary | ICD-10-CM | POA: Diagnosis not present

## 2017-01-08 DIAGNOSIS — Z9981 Dependence on supplemental oxygen: Secondary | ICD-10-CM | POA: Diagnosis not present

## 2017-01-11 DIAGNOSIS — R109 Unspecified abdominal pain: Secondary | ICD-10-CM | POA: Diagnosis not present

## 2017-01-11 DIAGNOSIS — R319 Hematuria, unspecified: Secondary | ICD-10-CM | POA: Diagnosis not present

## 2017-01-11 DIAGNOSIS — R1031 Right lower quadrant pain: Secondary | ICD-10-CM | POA: Diagnosis not present

## 2017-01-13 DIAGNOSIS — F418 Other specified anxiety disorders: Secondary | ICD-10-CM | POA: Diagnosis not present

## 2017-01-16 DIAGNOSIS — F418 Other specified anxiety disorders: Secondary | ICD-10-CM | POA: Diagnosis not present

## 2017-01-16 DIAGNOSIS — R0602 Shortness of breath: Secondary | ICD-10-CM | POA: Diagnosis not present

## 2017-01-16 DIAGNOSIS — J441 Chronic obstructive pulmonary disease with (acute) exacerbation: Secondary | ICD-10-CM | POA: Diagnosis not present

## 2017-01-16 DIAGNOSIS — J9602 Acute respiratory failure with hypercapnia: Secondary | ICD-10-CM | POA: Diagnosis not present

## 2017-01-16 DIAGNOSIS — G8929 Other chronic pain: Secondary | ICD-10-CM | POA: Diagnosis not present

## 2017-01-16 DIAGNOSIS — J9621 Acute and chronic respiratory failure with hypoxia: Secondary | ICD-10-CM | POA: Diagnosis not present

## 2017-01-16 DIAGNOSIS — R062 Wheezing: Secondary | ICD-10-CM | POA: Diagnosis not present

## 2017-01-16 DIAGNOSIS — R05 Cough: Secondary | ICD-10-CM | POA: Diagnosis not present

## 2017-01-16 DIAGNOSIS — G4733 Obstructive sleep apnea (adult) (pediatric): Secondary | ICD-10-CM | POA: Diagnosis not present

## 2017-01-16 DIAGNOSIS — J4520 Mild intermittent asthma, uncomplicated: Secondary | ICD-10-CM | POA: Diagnosis not present

## 2017-01-16 DIAGNOSIS — E872 Acidosis: Secondary | ICD-10-CM | POA: Diagnosis not present

## 2017-01-16 DIAGNOSIS — J449 Chronic obstructive pulmonary disease, unspecified: Secondary | ICD-10-CM | POA: Diagnosis not present

## 2017-01-16 DIAGNOSIS — R109 Unspecified abdominal pain: Secondary | ICD-10-CM | POA: Diagnosis not present

## 2017-01-19 DIAGNOSIS — R5383 Other fatigue: Secondary | ICD-10-CM | POA: Diagnosis not present

## 2017-01-19 DIAGNOSIS — J301 Allergic rhinitis due to pollen: Secondary | ICD-10-CM | POA: Diagnosis not present

## 2017-01-19 DIAGNOSIS — G4733 Obstructive sleep apnea (adult) (pediatric): Secondary | ICD-10-CM | POA: Diagnosis not present

## 2017-01-23 DIAGNOSIS — F419 Anxiety disorder, unspecified: Secondary | ICD-10-CM | POA: Diagnosis not present

## 2017-01-23 DIAGNOSIS — G629 Polyneuropathy, unspecified: Secondary | ICD-10-CM | POA: Diagnosis not present

## 2017-01-23 DIAGNOSIS — R1031 Right lower quadrant pain: Secondary | ICD-10-CM | POA: Diagnosis not present

## 2017-01-26 DIAGNOSIS — G4733 Obstructive sleep apnea (adult) (pediatric): Secondary | ICD-10-CM | POA: Diagnosis not present

## 2017-01-26 DIAGNOSIS — J4540 Moderate persistent asthma, uncomplicated: Secondary | ICD-10-CM | POA: Diagnosis not present

## 2017-01-30 DIAGNOSIS — Z79899 Other long term (current) drug therapy: Secondary | ICD-10-CM | POA: Diagnosis not present

## 2017-01-30 DIAGNOSIS — R270 Ataxia, unspecified: Secondary | ICD-10-CM | POA: Diagnosis not present

## 2017-01-30 DIAGNOSIS — Z87891 Personal history of nicotine dependence: Secondary | ICD-10-CM | POA: Diagnosis not present

## 2017-01-30 DIAGNOSIS — R252 Cramp and spasm: Secondary | ICD-10-CM | POA: Diagnosis not present

## 2017-01-30 DIAGNOSIS — R208 Other disturbances of skin sensation: Secondary | ICD-10-CM | POA: Diagnosis not present

## 2017-01-30 DIAGNOSIS — R2681 Unsteadiness on feet: Secondary | ICD-10-CM | POA: Diagnosis not present

## 2017-01-30 DIAGNOSIS — F410 Panic disorder [episodic paroxysmal anxiety] without agoraphobia: Secondary | ICD-10-CM | POA: Diagnosis not present

## 2017-01-30 DIAGNOSIS — Z7952 Long term (current) use of systemic steroids: Secondary | ICD-10-CM | POA: Diagnosis not present

## 2017-01-30 DIAGNOSIS — F418 Other specified anxiety disorders: Secondary | ICD-10-CM | POA: Diagnosis not present

## 2017-01-30 DIAGNOSIS — R292 Abnormal reflex: Secondary | ICD-10-CM | POA: Diagnosis not present

## 2017-01-30 DIAGNOSIS — Z820 Family history of epilepsy and other diseases of the nervous system: Secondary | ICD-10-CM | POA: Diagnosis not present

## 2017-01-30 DIAGNOSIS — Z9981 Dependence on supplemental oxygen: Secondary | ICD-10-CM | POA: Diagnosis not present

## 2017-01-30 DIAGNOSIS — G629 Polyneuropathy, unspecified: Secondary | ICD-10-CM | POA: Diagnosis not present

## 2017-01-30 DIAGNOSIS — Z7951 Long term (current) use of inhaled steroids: Secondary | ICD-10-CM | POA: Diagnosis not present

## 2017-01-30 DIAGNOSIS — Z888 Allergy status to other drugs, medicaments and biological substances status: Secondary | ICD-10-CM | POA: Diagnosis not present

## 2017-01-30 DIAGNOSIS — Z7982 Long term (current) use of aspirin: Secondary | ICD-10-CM | POA: Diagnosis not present

## 2017-01-30 DIAGNOSIS — J449 Chronic obstructive pulmonary disease, unspecified: Secondary | ICD-10-CM | POA: Diagnosis not present

## 2017-01-31 DIAGNOSIS — G4733 Obstructive sleep apnea (adult) (pediatric): Secondary | ICD-10-CM | POA: Diagnosis not present

## 2017-01-31 DIAGNOSIS — J4520 Mild intermittent asthma, uncomplicated: Secondary | ICD-10-CM | POA: Diagnosis not present

## 2017-01-31 DIAGNOSIS — J209 Acute bronchitis, unspecified: Secondary | ICD-10-CM | POA: Diagnosis not present

## 2017-01-31 DIAGNOSIS — J449 Chronic obstructive pulmonary disease, unspecified: Secondary | ICD-10-CM | POA: Diagnosis not present

## 2017-01-31 DIAGNOSIS — B370 Candidal stomatitis: Secondary | ICD-10-CM | POA: Diagnosis not present

## 2017-02-03 DIAGNOSIS — E610 Copper deficiency: Secondary | ICD-10-CM | POA: Diagnosis not present

## 2017-02-06 DIAGNOSIS — M6281 Muscle weakness (generalized): Secondary | ICD-10-CM | POA: Diagnosis not present

## 2017-02-06 DIAGNOSIS — R2689 Other abnormalities of gait and mobility: Secondary | ICD-10-CM | POA: Diagnosis not present

## 2017-02-10 DIAGNOSIS — R2689 Other abnormalities of gait and mobility: Secondary | ICD-10-CM | POA: Diagnosis not present

## 2017-02-10 DIAGNOSIS — M6281 Muscle weakness (generalized): Secondary | ICD-10-CM | POA: Diagnosis not present

## 2017-02-14 DIAGNOSIS — L501 Idiopathic urticaria: Secondary | ICD-10-CM | POA: Diagnosis not present

## 2017-02-16 DIAGNOSIS — J301 Allergic rhinitis due to pollen: Secondary | ICD-10-CM | POA: Diagnosis not present

## 2017-02-16 DIAGNOSIS — J4520 Mild intermittent asthma, uncomplicated: Secondary | ICD-10-CM | POA: Diagnosis not present

## 2017-02-16 DIAGNOSIS — G4733 Obstructive sleep apnea (adult) (pediatric): Secondary | ICD-10-CM | POA: Diagnosis not present

## 2017-02-16 DIAGNOSIS — R2689 Other abnormalities of gait and mobility: Secondary | ICD-10-CM | POA: Diagnosis not present

## 2017-02-16 DIAGNOSIS — J4540 Moderate persistent asthma, uncomplicated: Secondary | ICD-10-CM | POA: Diagnosis not present

## 2017-02-16 DIAGNOSIS — M6281 Muscle weakness (generalized): Secondary | ICD-10-CM | POA: Diagnosis not present

## 2017-02-17 DIAGNOSIS — J4520 Mild intermittent asthma, uncomplicated: Secondary | ICD-10-CM | POA: Diagnosis not present

## 2017-02-17 DIAGNOSIS — J449 Chronic obstructive pulmonary disease, unspecified: Secondary | ICD-10-CM | POA: Diagnosis not present

## 2017-02-17 DIAGNOSIS — G4733 Obstructive sleep apnea (adult) (pediatric): Secondary | ICD-10-CM | POA: Diagnosis not present

## 2017-02-24 DIAGNOSIS — R2689 Other abnormalities of gait and mobility: Secondary | ICD-10-CM | POA: Diagnosis not present

## 2017-02-24 DIAGNOSIS — M6281 Muscle weakness (generalized): Secondary | ICD-10-CM | POA: Diagnosis not present

## 2017-03-03 DIAGNOSIS — J4520 Mild intermittent asthma, uncomplicated: Secondary | ICD-10-CM | POA: Diagnosis not present

## 2017-03-03 DIAGNOSIS — G4733 Obstructive sleep apnea (adult) (pediatric): Secondary | ICD-10-CM | POA: Diagnosis not present

## 2017-03-03 DIAGNOSIS — J449 Chronic obstructive pulmonary disease, unspecified: Secondary | ICD-10-CM | POA: Diagnosis not present

## 2017-03-06 DIAGNOSIS — R2689 Other abnormalities of gait and mobility: Secondary | ICD-10-CM | POA: Diagnosis not present

## 2017-03-06 DIAGNOSIS — M6281 Muscle weakness (generalized): Secondary | ICD-10-CM | POA: Diagnosis not present

## 2017-03-20 DIAGNOSIS — J449 Chronic obstructive pulmonary disease, unspecified: Secondary | ICD-10-CM | POA: Diagnosis not present

## 2017-03-20 DIAGNOSIS — G4733 Obstructive sleep apnea (adult) (pediatric): Secondary | ICD-10-CM | POA: Diagnosis not present

## 2017-03-20 DIAGNOSIS — J4520 Mild intermittent asthma, uncomplicated: Secondary | ICD-10-CM | POA: Diagnosis not present

## 2017-03-24 DIAGNOSIS — E610 Copper deficiency: Secondary | ICD-10-CM | POA: Diagnosis not present

## 2017-03-28 ENCOUNTER — Telehealth: Payer: Self-pay | Admitting: Allergy and Immunology

## 2017-03-28 NOTE — Telephone Encounter (Signed)
patient needs records on allergy testing and testing for birds - patient wants to pick them up

## 2017-03-29 DIAGNOSIS — J301 Allergic rhinitis due to pollen: Secondary | ICD-10-CM | POA: Diagnosis not present

## 2017-03-29 DIAGNOSIS — J4540 Moderate persistent asthma, uncomplicated: Secondary | ICD-10-CM | POA: Diagnosis not present

## 2017-03-29 NOTE — Telephone Encounter (Signed)
Called patient and advised same ready up front to pick up

## 2017-04-02 DIAGNOSIS — J4520 Mild intermittent asthma, uncomplicated: Secondary | ICD-10-CM | POA: Diagnosis not present

## 2017-04-02 DIAGNOSIS — G4733 Obstructive sleep apnea (adult) (pediatric): Secondary | ICD-10-CM | POA: Diagnosis not present

## 2017-04-02 DIAGNOSIS — J449 Chronic obstructive pulmonary disease, unspecified: Secondary | ICD-10-CM | POA: Diagnosis not present

## 2017-04-11 DIAGNOSIS — J449 Chronic obstructive pulmonary disease, unspecified: Secondary | ICD-10-CM | POA: Diagnosis not present

## 2017-04-11 DIAGNOSIS — J3489 Other specified disorders of nose and nasal sinuses: Secondary | ICD-10-CM | POA: Diagnosis not present

## 2017-04-11 DIAGNOSIS — G4733 Obstructive sleep apnea (adult) (pediatric): Secondary | ICD-10-CM | POA: Diagnosis not present

## 2017-04-11 DIAGNOSIS — Z7951 Long term (current) use of inhaled steroids: Secondary | ICD-10-CM | POA: Diagnosis not present

## 2017-04-11 DIAGNOSIS — Z9981 Dependence on supplemental oxygen: Secondary | ICD-10-CM | POA: Diagnosis not present

## 2017-04-11 DIAGNOSIS — Z888 Allergy status to other drugs, medicaments and biological substances status: Secondary | ICD-10-CM | POA: Diagnosis not present

## 2017-04-11 DIAGNOSIS — Z87891 Personal history of nicotine dependence: Secondary | ICD-10-CM | POA: Diagnosis not present

## 2017-04-11 DIAGNOSIS — J342 Deviated nasal septum: Secondary | ICD-10-CM | POA: Diagnosis not present

## 2017-04-11 DIAGNOSIS — Z79899 Other long term (current) drug therapy: Secondary | ICD-10-CM | POA: Diagnosis not present

## 2017-04-11 DIAGNOSIS — R0981 Nasal congestion: Secondary | ICD-10-CM | POA: Diagnosis not present

## 2017-04-11 DIAGNOSIS — Z872 Personal history of diseases of the skin and subcutaneous tissue: Secondary | ICD-10-CM | POA: Diagnosis not present

## 2017-04-14 DIAGNOSIS — L501 Idiopathic urticaria: Secondary | ICD-10-CM | POA: Diagnosis not present

## 2017-04-14 DIAGNOSIS — R21 Rash and other nonspecific skin eruption: Secondary | ICD-10-CM | POA: Diagnosis not present

## 2017-04-19 DIAGNOSIS — J449 Chronic obstructive pulmonary disease, unspecified: Secondary | ICD-10-CM | POA: Diagnosis not present

## 2017-04-19 DIAGNOSIS — G4733 Obstructive sleep apnea (adult) (pediatric): Secondary | ICD-10-CM | POA: Diagnosis not present

## 2017-04-19 DIAGNOSIS — J4520 Mild intermittent asthma, uncomplicated: Secondary | ICD-10-CM | POA: Diagnosis not present

## 2017-05-01 DIAGNOSIS — J449 Chronic obstructive pulmonary disease, unspecified: Secondary | ICD-10-CM | POA: Diagnosis not present

## 2017-05-01 DIAGNOSIS — R0981 Nasal congestion: Secondary | ICD-10-CM | POA: Diagnosis not present

## 2017-05-01 DIAGNOSIS — J3489 Other specified disorders of nose and nasal sinuses: Secondary | ICD-10-CM | POA: Diagnosis not present

## 2017-05-01 DIAGNOSIS — J310 Chronic rhinitis: Secondary | ICD-10-CM | POA: Diagnosis not present

## 2017-05-01 DIAGNOSIS — Z9981 Dependence on supplemental oxygen: Secondary | ICD-10-CM | POA: Diagnosis not present

## 2017-05-01 DIAGNOSIS — G4733 Obstructive sleep apnea (adult) (pediatric): Secondary | ICD-10-CM | POA: Diagnosis not present

## 2017-05-01 DIAGNOSIS — J342 Deviated nasal septum: Secondary | ICD-10-CM | POA: Diagnosis not present

## 2017-05-01 DIAGNOSIS — Z87891 Personal history of nicotine dependence: Secondary | ICD-10-CM | POA: Diagnosis not present

## 2017-05-03 DIAGNOSIS — J4520 Mild intermittent asthma, uncomplicated: Secondary | ICD-10-CM | POA: Diagnosis not present

## 2017-05-03 DIAGNOSIS — G4733 Obstructive sleep apnea (adult) (pediatric): Secondary | ICD-10-CM | POA: Diagnosis not present

## 2017-05-03 DIAGNOSIS — J449 Chronic obstructive pulmonary disease, unspecified: Secondary | ICD-10-CM | POA: Diagnosis not present

## 2017-05-08 DIAGNOSIS — J301 Allergic rhinitis due to pollen: Secondary | ICD-10-CM | POA: Diagnosis not present

## 2017-05-08 DIAGNOSIS — F411 Generalized anxiety disorder: Secondary | ICD-10-CM | POA: Diagnosis not present

## 2017-05-08 DIAGNOSIS — G4733 Obstructive sleep apnea (adult) (pediatric): Secondary | ICD-10-CM | POA: Diagnosis not present

## 2017-05-08 DIAGNOSIS — J4540 Moderate persistent asthma, uncomplicated: Secondary | ICD-10-CM | POA: Diagnosis not present

## 2017-05-08 DIAGNOSIS — R5383 Other fatigue: Secondary | ICD-10-CM | POA: Diagnosis not present

## 2017-05-15 DIAGNOSIS — R1011 Right upper quadrant pain: Secondary | ICD-10-CM | POA: Diagnosis not present

## 2017-05-15 DIAGNOSIS — R140 Abdominal distension (gaseous): Secondary | ICD-10-CM | POA: Diagnosis not present

## 2017-05-15 DIAGNOSIS — K219 Gastro-esophageal reflux disease without esophagitis: Secondary | ICD-10-CM | POA: Diagnosis not present

## 2017-05-15 DIAGNOSIS — G8929 Other chronic pain: Secondary | ICD-10-CM | POA: Diagnosis not present

## 2017-05-15 DIAGNOSIS — G6289 Other specified polyneuropathies: Secondary | ICD-10-CM | POA: Diagnosis not present

## 2017-05-15 DIAGNOSIS — R252 Cramp and spasm: Secondary | ICD-10-CM | POA: Diagnosis not present

## 2017-05-15 DIAGNOSIS — G629 Polyneuropathy, unspecified: Secondary | ICD-10-CM | POA: Diagnosis not present

## 2017-05-19 DIAGNOSIS — M545 Low back pain: Secondary | ICD-10-CM | POA: Diagnosis not present

## 2017-05-19 DIAGNOSIS — R109 Unspecified abdominal pain: Secondary | ICD-10-CM | POA: Diagnosis not present

## 2017-05-19 DIAGNOSIS — M160 Bilateral primary osteoarthritis of hip: Secondary | ICD-10-CM | POA: Diagnosis not present

## 2017-05-19 DIAGNOSIS — R9431 Abnormal electrocardiogram [ECG] [EKG]: Secondary | ICD-10-CM | POA: Diagnosis not present

## 2017-05-19 DIAGNOSIS — F410 Panic disorder [episodic paroxysmal anxiety] without agoraphobia: Secondary | ICD-10-CM | POA: Diagnosis not present

## 2017-05-19 DIAGNOSIS — K219 Gastro-esophageal reflux disease without esophagitis: Secondary | ICD-10-CM | POA: Diagnosis not present

## 2017-05-19 DIAGNOSIS — G8929 Other chronic pain: Secondary | ICD-10-CM | POA: Diagnosis not present

## 2017-05-19 DIAGNOSIS — M797 Fibromyalgia: Secondary | ICD-10-CM | POA: Diagnosis not present

## 2017-05-19 DIAGNOSIS — J342 Deviated nasal septum: Secondary | ICD-10-CM | POA: Diagnosis not present

## 2017-05-19 DIAGNOSIS — K5900 Constipation, unspecified: Secondary | ICD-10-CM | POA: Diagnosis not present

## 2017-05-19 DIAGNOSIS — G629 Polyneuropathy, unspecified: Secondary | ICD-10-CM | POA: Diagnosis not present

## 2017-05-19 DIAGNOSIS — Z87891 Personal history of nicotine dependence: Secondary | ICD-10-CM | POA: Diagnosis not present

## 2017-05-19 DIAGNOSIS — J449 Chronic obstructive pulmonary disease, unspecified: Secondary | ICD-10-CM | POA: Diagnosis not present

## 2017-05-19 DIAGNOSIS — J343 Hypertrophy of nasal turbinates: Secondary | ICD-10-CM | POA: Diagnosis not present

## 2017-05-19 DIAGNOSIS — G43909 Migraine, unspecified, not intractable, without status migrainosus: Secondary | ICD-10-CM | POA: Diagnosis not present

## 2017-05-19 DIAGNOSIS — J3489 Other specified disorders of nose and nasal sinuses: Secondary | ICD-10-CM | POA: Diagnosis not present

## 2017-05-19 DIAGNOSIS — Z9981 Dependence on supplemental oxygen: Secondary | ICD-10-CM | POA: Diagnosis not present

## 2017-05-19 DIAGNOSIS — D649 Anemia, unspecified: Secondary | ICD-10-CM | POA: Diagnosis not present

## 2017-05-19 DIAGNOSIS — R079 Chest pain, unspecified: Secondary | ICD-10-CM | POA: Diagnosis not present

## 2017-05-19 DIAGNOSIS — G4700 Insomnia, unspecified: Secondary | ICD-10-CM | POA: Diagnosis not present

## 2017-05-20 DIAGNOSIS — J4520 Mild intermittent asthma, uncomplicated: Secondary | ICD-10-CM | POA: Diagnosis not present

## 2017-05-20 DIAGNOSIS — G4733 Obstructive sleep apnea (adult) (pediatric): Secondary | ICD-10-CM | POA: Diagnosis not present

## 2017-05-20 DIAGNOSIS — J449 Chronic obstructive pulmonary disease, unspecified: Secondary | ICD-10-CM | POA: Diagnosis not present

## 2017-05-26 DIAGNOSIS — I700 Atherosclerosis of aorta: Secondary | ICD-10-CM | POA: Diagnosis not present

## 2017-05-26 DIAGNOSIS — G8929 Other chronic pain: Secondary | ICD-10-CM | POA: Diagnosis not present

## 2017-05-26 DIAGNOSIS — J90 Pleural effusion, not elsewhere classified: Secondary | ICD-10-CM | POA: Diagnosis not present

## 2017-05-26 DIAGNOSIS — R1011 Right upper quadrant pain: Secondary | ICD-10-CM | POA: Diagnosis not present

## 2017-05-30 DIAGNOSIS — J343 Hypertrophy of nasal turbinates: Secondary | ICD-10-CM | POA: Diagnosis not present

## 2017-05-30 DIAGNOSIS — R079 Chest pain, unspecified: Secondary | ICD-10-CM | POA: Diagnosis not present

## 2017-05-30 DIAGNOSIS — J449 Chronic obstructive pulmonary disease, unspecified: Secondary | ICD-10-CM | POA: Diagnosis not present

## 2017-05-30 DIAGNOSIS — Z9981 Dependence on supplemental oxygen: Secondary | ICD-10-CM | POA: Diagnosis not present

## 2017-05-30 DIAGNOSIS — J3489 Other specified disorders of nose and nasal sinuses: Secondary | ICD-10-CM | POA: Diagnosis not present

## 2017-05-30 DIAGNOSIS — J342 Deviated nasal septum: Secondary | ICD-10-CM | POA: Diagnosis not present

## 2017-06-03 DIAGNOSIS — G4733 Obstructive sleep apnea (adult) (pediatric): Secondary | ICD-10-CM | POA: Diagnosis not present

## 2017-06-03 DIAGNOSIS — J449 Chronic obstructive pulmonary disease, unspecified: Secondary | ICD-10-CM | POA: Diagnosis not present

## 2017-06-03 DIAGNOSIS — J4520 Mild intermittent asthma, uncomplicated: Secondary | ICD-10-CM | POA: Diagnosis not present

## 2017-06-13 DIAGNOSIS — J301 Allergic rhinitis due to pollen: Secondary | ICD-10-CM | POA: Diagnosis not present

## 2017-06-13 DIAGNOSIS — J4551 Severe persistent asthma with (acute) exacerbation: Secondary | ICD-10-CM | POA: Diagnosis not present

## 2017-06-13 DIAGNOSIS — F411 Generalized anxiety disorder: Secondary | ICD-10-CM | POA: Diagnosis not present

## 2017-06-13 DIAGNOSIS — G4733 Obstructive sleep apnea (adult) (pediatric): Secondary | ICD-10-CM | POA: Diagnosis not present

## 2017-06-20 DIAGNOSIS — J4520 Mild intermittent asthma, uncomplicated: Secondary | ICD-10-CM | POA: Diagnosis not present

## 2017-06-20 DIAGNOSIS — J449 Chronic obstructive pulmonary disease, unspecified: Secondary | ICD-10-CM | POA: Diagnosis not present

## 2017-06-20 DIAGNOSIS — G4733 Obstructive sleep apnea (adult) (pediatric): Secondary | ICD-10-CM | POA: Diagnosis not present

## 2017-07-14 DIAGNOSIS — R5383 Other fatigue: Secondary | ICD-10-CM | POA: Diagnosis not present

## 2017-07-18 DIAGNOSIS — J301 Allergic rhinitis due to pollen: Secondary | ICD-10-CM | POA: Diagnosis not present

## 2017-07-18 DIAGNOSIS — G4733 Obstructive sleep apnea (adult) (pediatric): Secondary | ICD-10-CM | POA: Diagnosis not present

## 2017-07-20 DIAGNOSIS — G4733 Obstructive sleep apnea (adult) (pediatric): Secondary | ICD-10-CM | POA: Diagnosis not present

## 2017-07-20 DIAGNOSIS — J449 Chronic obstructive pulmonary disease, unspecified: Secondary | ICD-10-CM | POA: Diagnosis not present

## 2017-07-20 DIAGNOSIS — J4520 Mild intermittent asthma, uncomplicated: Secondary | ICD-10-CM | POA: Diagnosis not present

## 2017-07-26 DIAGNOSIS — J301 Allergic rhinitis due to pollen: Secondary | ICD-10-CM | POA: Diagnosis not present

## 2017-07-26 DIAGNOSIS — J4540 Moderate persistent asthma, uncomplicated: Secondary | ICD-10-CM | POA: Diagnosis not present

## 2017-07-26 DIAGNOSIS — G4733 Obstructive sleep apnea (adult) (pediatric): Secondary | ICD-10-CM | POA: Diagnosis not present

## 2017-08-02 DIAGNOSIS — G4733 Obstructive sleep apnea (adult) (pediatric): Secondary | ICD-10-CM | POA: Diagnosis not present

## 2017-08-20 DIAGNOSIS — J449 Chronic obstructive pulmonary disease, unspecified: Secondary | ICD-10-CM | POA: Diagnosis not present

## 2017-08-20 DIAGNOSIS — G4733 Obstructive sleep apnea (adult) (pediatric): Secondary | ICD-10-CM | POA: Diagnosis not present

## 2017-08-20 DIAGNOSIS — J4520 Mild intermittent asthma, uncomplicated: Secondary | ICD-10-CM | POA: Diagnosis not present

## 2017-08-23 DIAGNOSIS — R5383 Other fatigue: Secondary | ICD-10-CM | POA: Diagnosis not present

## 2017-08-23 DIAGNOSIS — J4550 Severe persistent asthma, uncomplicated: Secondary | ICD-10-CM | POA: Diagnosis not present

## 2017-08-23 DIAGNOSIS — G4733 Obstructive sleep apnea (adult) (pediatric): Secondary | ICD-10-CM | POA: Diagnosis not present

## 2017-08-30 DIAGNOSIS — J301 Allergic rhinitis due to pollen: Secondary | ICD-10-CM | POA: Diagnosis not present

## 2017-09-06 DIAGNOSIS — J301 Allergic rhinitis due to pollen: Secondary | ICD-10-CM | POA: Diagnosis not present

## 2017-09-12 DIAGNOSIS — J301 Allergic rhinitis due to pollen: Secondary | ICD-10-CM | POA: Diagnosis not present

## 2017-09-18 DIAGNOSIS — G629 Polyneuropathy, unspecified: Secondary | ICD-10-CM | POA: Diagnosis not present

## 2017-09-19 DIAGNOSIS — J4520 Mild intermittent asthma, uncomplicated: Secondary | ICD-10-CM | POA: Diagnosis not present

## 2017-09-19 DIAGNOSIS — Z0271 Encounter for disability determination: Secondary | ICD-10-CM | POA: Diagnosis not present

## 2017-09-19 DIAGNOSIS — G4733 Obstructive sleep apnea (adult) (pediatric): Secondary | ICD-10-CM | POA: Diagnosis not present

## 2017-09-19 DIAGNOSIS — J449 Chronic obstructive pulmonary disease, unspecified: Secondary | ICD-10-CM | POA: Diagnosis not present

## 2017-09-19 DIAGNOSIS — G8929 Other chronic pain: Secondary | ICD-10-CM | POA: Diagnosis not present

## 2017-09-19 DIAGNOSIS — M5440 Lumbago with sciatica, unspecified side: Secondary | ICD-10-CM | POA: Diagnosis not present

## 2017-09-20 DIAGNOSIS — J301 Allergic rhinitis due to pollen: Secondary | ICD-10-CM | POA: Diagnosis not present

## 2017-09-27 DIAGNOSIS — J301 Allergic rhinitis due to pollen: Secondary | ICD-10-CM | POA: Diagnosis not present

## 2017-10-04 DIAGNOSIS — G4733 Obstructive sleep apnea (adult) (pediatric): Secondary | ICD-10-CM | POA: Diagnosis not present

## 2017-10-04 DIAGNOSIS — Z87891 Personal history of nicotine dependence: Secondary | ICD-10-CM | POA: Diagnosis not present

## 2017-10-04 DIAGNOSIS — J301 Allergic rhinitis due to pollen: Secondary | ICD-10-CM | POA: Diagnosis not present

## 2017-10-04 DIAGNOSIS — J4551 Severe persistent asthma with (acute) exacerbation: Secondary | ICD-10-CM | POA: Diagnosis not present

## 2017-10-06 DIAGNOSIS — J449 Chronic obstructive pulmonary disease, unspecified: Secondary | ICD-10-CM | POA: Diagnosis not present

## 2017-10-06 DIAGNOSIS — G4733 Obstructive sleep apnea (adult) (pediatric): Secondary | ICD-10-CM | POA: Diagnosis not present

## 2017-10-06 DIAGNOSIS — J4520 Mild intermittent asthma, uncomplicated: Secondary | ICD-10-CM | POA: Diagnosis not present

## 2017-10-11 DIAGNOSIS — G4733 Obstructive sleep apnea (adult) (pediatric): Secondary | ICD-10-CM | POA: Diagnosis not present

## 2017-10-11 DIAGNOSIS — J4550 Severe persistent asthma, uncomplicated: Secondary | ICD-10-CM | POA: Diagnosis not present

## 2017-10-11 DIAGNOSIS — J301 Allergic rhinitis due to pollen: Secondary | ICD-10-CM | POA: Diagnosis not present

## 2017-10-18 DIAGNOSIS — J301 Allergic rhinitis due to pollen: Secondary | ICD-10-CM | POA: Diagnosis not present

## 2017-10-20 DIAGNOSIS — G4733 Obstructive sleep apnea (adult) (pediatric): Secondary | ICD-10-CM | POA: Diagnosis not present

## 2017-10-20 DIAGNOSIS — J4520 Mild intermittent asthma, uncomplicated: Secondary | ICD-10-CM | POA: Diagnosis not present

## 2017-10-20 DIAGNOSIS — J449 Chronic obstructive pulmonary disease, unspecified: Secondary | ICD-10-CM | POA: Diagnosis not present

## 2017-10-25 DIAGNOSIS — J301 Allergic rhinitis due to pollen: Secondary | ICD-10-CM | POA: Diagnosis not present

## 2017-10-27 DIAGNOSIS — H109 Unspecified conjunctivitis: Secondary | ICD-10-CM | POA: Diagnosis not present

## 2017-10-27 DIAGNOSIS — H66001 Acute suppurative otitis media without spontaneous rupture of ear drum, right ear: Secondary | ICD-10-CM | POA: Diagnosis not present

## 2017-11-06 DIAGNOSIS — J449 Chronic obstructive pulmonary disease, unspecified: Secondary | ICD-10-CM | POA: Diagnosis not present

## 2017-11-06 DIAGNOSIS — J4520 Mild intermittent asthma, uncomplicated: Secondary | ICD-10-CM | POA: Diagnosis not present

## 2017-11-06 DIAGNOSIS — G4733 Obstructive sleep apnea (adult) (pediatric): Secondary | ICD-10-CM | POA: Diagnosis not present

## 2017-11-08 DIAGNOSIS — J301 Allergic rhinitis due to pollen: Secondary | ICD-10-CM | POA: Diagnosis not present

## 2017-11-10 DIAGNOSIS — J329 Chronic sinusitis, unspecified: Secondary | ICD-10-CM | POA: Diagnosis not present

## 2017-11-15 DIAGNOSIS — J301 Allergic rhinitis due to pollen: Secondary | ICD-10-CM | POA: Diagnosis not present

## 2017-11-20 DIAGNOSIS — J449 Chronic obstructive pulmonary disease, unspecified: Secondary | ICD-10-CM | POA: Diagnosis not present

## 2017-11-20 DIAGNOSIS — G4733 Obstructive sleep apnea (adult) (pediatric): Secondary | ICD-10-CM | POA: Diagnosis not present

## 2017-11-20 DIAGNOSIS — J4520 Mild intermittent asthma, uncomplicated: Secondary | ICD-10-CM | POA: Diagnosis not present

## 2017-11-22 DIAGNOSIS — J301 Allergic rhinitis due to pollen: Secondary | ICD-10-CM | POA: Diagnosis not present

## 2017-11-28 DIAGNOSIS — J449 Chronic obstructive pulmonary disease, unspecified: Secondary | ICD-10-CM | POA: Diagnosis not present

## 2017-11-28 DIAGNOSIS — R0902 Hypoxemia: Secondary | ICD-10-CM | POA: Diagnosis not present

## 2017-11-28 DIAGNOSIS — G4733 Obstructive sleep apnea (adult) (pediatric): Secondary | ICD-10-CM | POA: Diagnosis not present

## 2017-11-29 DIAGNOSIS — J301 Allergic rhinitis due to pollen: Secondary | ICD-10-CM | POA: Diagnosis not present

## 2017-11-29 DIAGNOSIS — J441 Chronic obstructive pulmonary disease with (acute) exacerbation: Secondary | ICD-10-CM | POA: Diagnosis not present

## 2017-11-29 DIAGNOSIS — R062 Wheezing: Secondary | ICD-10-CM | POA: Diagnosis not present

## 2017-12-04 DIAGNOSIS — J4551 Severe persistent asthma with (acute) exacerbation: Secondary | ICD-10-CM | POA: Diagnosis not present

## 2017-12-04 DIAGNOSIS — G4733 Obstructive sleep apnea (adult) (pediatric): Secondary | ICD-10-CM | POA: Diagnosis not present

## 2017-12-04 DIAGNOSIS — J301 Allergic rhinitis due to pollen: Secondary | ICD-10-CM | POA: Diagnosis not present

## 2017-12-04 DIAGNOSIS — J449 Chronic obstructive pulmonary disease, unspecified: Secondary | ICD-10-CM | POA: Diagnosis not present

## 2017-12-04 DIAGNOSIS — J4520 Mild intermittent asthma, uncomplicated: Secondary | ICD-10-CM | POA: Diagnosis not present

## 2017-12-06 DIAGNOSIS — G4733 Obstructive sleep apnea (adult) (pediatric): Secondary | ICD-10-CM | POA: Diagnosis not present

## 2017-12-06 DIAGNOSIS — J301 Allergic rhinitis due to pollen: Secondary | ICD-10-CM | POA: Diagnosis not present

## 2017-12-11 DIAGNOSIS — J4530 Mild persistent asthma, uncomplicated: Secondary | ICD-10-CM | POA: Diagnosis not present

## 2017-12-11 DIAGNOSIS — J301 Allergic rhinitis due to pollen: Secondary | ICD-10-CM | POA: Diagnosis not present

## 2017-12-18 DIAGNOSIS — G4733 Obstructive sleep apnea (adult) (pediatric): Secondary | ICD-10-CM | POA: Diagnosis not present

## 2017-12-18 DIAGNOSIS — J449 Chronic obstructive pulmonary disease, unspecified: Secondary | ICD-10-CM | POA: Diagnosis not present

## 2017-12-18 DIAGNOSIS — J4520 Mild intermittent asthma, uncomplicated: Secondary | ICD-10-CM | POA: Diagnosis not present

## 2017-12-20 DIAGNOSIS — J301 Allergic rhinitis due to pollen: Secondary | ICD-10-CM | POA: Diagnosis not present

## 2017-12-27 DIAGNOSIS — J301 Allergic rhinitis due to pollen: Secondary | ICD-10-CM | POA: Diagnosis not present

## 2018-01-02 DIAGNOSIS — H1044 Vernal conjunctivitis: Secondary | ICD-10-CM | POA: Diagnosis not present

## 2018-01-03 DIAGNOSIS — J301 Allergic rhinitis due to pollen: Secondary | ICD-10-CM | POA: Diagnosis not present

## 2018-01-04 DIAGNOSIS — G4733 Obstructive sleep apnea (adult) (pediatric): Secondary | ICD-10-CM | POA: Diagnosis not present

## 2018-01-04 DIAGNOSIS — J449 Chronic obstructive pulmonary disease, unspecified: Secondary | ICD-10-CM | POA: Diagnosis not present

## 2018-01-04 DIAGNOSIS — J4520 Mild intermittent asthma, uncomplicated: Secondary | ICD-10-CM | POA: Diagnosis not present

## 2018-01-10 DIAGNOSIS — J301 Allergic rhinitis due to pollen: Secondary | ICD-10-CM | POA: Diagnosis not present

## 2018-01-11 DIAGNOSIS — H1045 Other chronic allergic conjunctivitis: Secondary | ICD-10-CM | POA: Diagnosis not present

## 2018-01-17 DIAGNOSIS — J301 Allergic rhinitis due to pollen: Secondary | ICD-10-CM | POA: Diagnosis not present

## 2018-01-18 DIAGNOSIS — J449 Chronic obstructive pulmonary disease, unspecified: Secondary | ICD-10-CM | POA: Diagnosis not present

## 2018-01-18 DIAGNOSIS — G4733 Obstructive sleep apnea (adult) (pediatric): Secondary | ICD-10-CM | POA: Diagnosis not present

## 2018-01-18 DIAGNOSIS — J4520 Mild intermittent asthma, uncomplicated: Secondary | ICD-10-CM | POA: Diagnosis not present

## 2018-01-24 DIAGNOSIS — J301 Allergic rhinitis due to pollen: Secondary | ICD-10-CM | POA: Diagnosis not present

## 2018-01-29 DIAGNOSIS — E610 Copper deficiency: Secondary | ICD-10-CM | POA: Diagnosis not present

## 2018-01-29 DIAGNOSIS — R252 Cramp and spasm: Secondary | ICD-10-CM | POA: Diagnosis not present

## 2018-01-29 DIAGNOSIS — G629 Polyneuropathy, unspecified: Secondary | ICD-10-CM | POA: Diagnosis not present

## 2018-01-29 DIAGNOSIS — E538 Deficiency of other specified B group vitamins: Secondary | ICD-10-CM | POA: Diagnosis not present

## 2018-02-03 DIAGNOSIS — J449 Chronic obstructive pulmonary disease, unspecified: Secondary | ICD-10-CM | POA: Diagnosis not present

## 2018-02-03 DIAGNOSIS — G4733 Obstructive sleep apnea (adult) (pediatric): Secondary | ICD-10-CM | POA: Diagnosis not present

## 2018-02-03 DIAGNOSIS — J4520 Mild intermittent asthma, uncomplicated: Secondary | ICD-10-CM | POA: Diagnosis not present

## 2018-02-17 DIAGNOSIS — J4520 Mild intermittent asthma, uncomplicated: Secondary | ICD-10-CM | POA: Diagnosis not present

## 2018-02-17 DIAGNOSIS — J449 Chronic obstructive pulmonary disease, unspecified: Secondary | ICD-10-CM | POA: Diagnosis not present

## 2018-02-17 DIAGNOSIS — G4733 Obstructive sleep apnea (adult) (pediatric): Secondary | ICD-10-CM | POA: Diagnosis not present

## 2018-02-19 DIAGNOSIS — H1045 Other chronic allergic conjunctivitis: Secondary | ICD-10-CM | POA: Diagnosis not present

## 2018-02-20 DIAGNOSIS — L300 Nummular dermatitis: Secondary | ICD-10-CM | POA: Diagnosis not present

## 2018-02-28 DIAGNOSIS — J0110 Acute frontal sinusitis, unspecified: Secondary | ICD-10-CM | POA: Diagnosis not present

## 2018-02-28 DIAGNOSIS — R5383 Other fatigue: Secondary | ICD-10-CM | POA: Diagnosis not present

## 2018-03-06 DIAGNOSIS — G4733 Obstructive sleep apnea (adult) (pediatric): Secondary | ICD-10-CM | POA: Diagnosis not present

## 2018-03-06 DIAGNOSIS — J449 Chronic obstructive pulmonary disease, unspecified: Secondary | ICD-10-CM | POA: Diagnosis not present

## 2018-03-06 DIAGNOSIS — G629 Polyneuropathy, unspecified: Secondary | ICD-10-CM | POA: Diagnosis not present

## 2018-03-06 DIAGNOSIS — J4520 Mild intermittent asthma, uncomplicated: Secondary | ICD-10-CM | POA: Diagnosis not present

## 2018-03-07 DIAGNOSIS — J4541 Moderate persistent asthma with (acute) exacerbation: Secondary | ICD-10-CM | POA: Diagnosis not present

## 2018-03-07 DIAGNOSIS — G4733 Obstructive sleep apnea (adult) (pediatric): Secondary | ICD-10-CM | POA: Diagnosis not present

## 2018-03-07 DIAGNOSIS — R5383 Other fatigue: Secondary | ICD-10-CM | POA: Diagnosis not present

## 2018-03-07 DIAGNOSIS — J301 Allergic rhinitis due to pollen: Secondary | ICD-10-CM | POA: Diagnosis not present

## 2018-03-12 DIAGNOSIS — J4540 Moderate persistent asthma, uncomplicated: Secondary | ICD-10-CM | POA: Diagnosis not present

## 2018-03-12 DIAGNOSIS — R5383 Other fatigue: Secondary | ICD-10-CM | POA: Diagnosis not present

## 2018-03-12 DIAGNOSIS — J301 Allergic rhinitis due to pollen: Secondary | ICD-10-CM | POA: Diagnosis not present

## 2018-03-20 DIAGNOSIS — J449 Chronic obstructive pulmonary disease, unspecified: Secondary | ICD-10-CM | POA: Diagnosis not present

## 2018-03-20 DIAGNOSIS — G4733 Obstructive sleep apnea (adult) (pediatric): Secondary | ICD-10-CM | POA: Diagnosis not present

## 2018-03-20 DIAGNOSIS — J4520 Mild intermittent asthma, uncomplicated: Secondary | ICD-10-CM | POA: Diagnosis not present

## 2018-03-27 DIAGNOSIS — R0902 Hypoxemia: Secondary | ICD-10-CM | POA: Diagnosis not present

## 2018-03-27 DIAGNOSIS — J441 Chronic obstructive pulmonary disease with (acute) exacerbation: Secondary | ICD-10-CM | POA: Diagnosis not present

## 2018-03-28 DIAGNOSIS — J441 Chronic obstructive pulmonary disease with (acute) exacerbation: Secondary | ICD-10-CM | POA: Diagnosis not present

## 2018-03-28 DIAGNOSIS — E872 Acidosis: Secondary | ICD-10-CM | POA: Diagnosis not present

## 2018-03-28 DIAGNOSIS — R0902 Hypoxemia: Secondary | ICD-10-CM | POA: Diagnosis not present

## 2018-03-28 DIAGNOSIS — D72829 Elevated white blood cell count, unspecified: Secondary | ICD-10-CM | POA: Diagnosis not present

## 2018-03-28 DIAGNOSIS — J9621 Acute and chronic respiratory failure with hypoxia: Secondary | ICD-10-CM | POA: Diagnosis not present

## 2018-03-28 DIAGNOSIS — R0602 Shortness of breath: Secondary | ICD-10-CM | POA: Diagnosis not present

## 2018-03-28 DIAGNOSIS — R05 Cough: Secondary | ICD-10-CM | POA: Diagnosis not present

## 2018-03-29 DIAGNOSIS — R0902 Hypoxemia: Secondary | ICD-10-CM

## 2018-04-03 DIAGNOSIS — R5383 Other fatigue: Secondary | ICD-10-CM | POA: Diagnosis not present

## 2018-04-03 DIAGNOSIS — J4550 Severe persistent asthma, uncomplicated: Secondary | ICD-10-CM | POA: Diagnosis not present

## 2018-04-03 DIAGNOSIS — J301 Allergic rhinitis due to pollen: Secondary | ICD-10-CM | POA: Diagnosis not present

## 2018-04-03 DIAGNOSIS — F411 Generalized anxiety disorder: Secondary | ICD-10-CM | POA: Diagnosis not present

## 2018-04-03 DIAGNOSIS — G4733 Obstructive sleep apnea (adult) (pediatric): Secondary | ICD-10-CM | POA: Diagnosis not present

## 2018-04-04 DIAGNOSIS — J449 Chronic obstructive pulmonary disease, unspecified: Secondary | ICD-10-CM | POA: Diagnosis not present

## 2018-04-04 DIAGNOSIS — G4733 Obstructive sleep apnea (adult) (pediatric): Secondary | ICD-10-CM | POA: Diagnosis not present

## 2018-04-04 DIAGNOSIS — J4520 Mild intermittent asthma, uncomplicated: Secondary | ICD-10-CM | POA: Diagnosis not present

## 2018-04-06 ENCOUNTER — Emergency Department (HOSPITAL_COMMUNITY): Payer: 59

## 2018-04-06 ENCOUNTER — Other Ambulatory Visit: Payer: Self-pay

## 2018-04-06 ENCOUNTER — Encounter (HOSPITAL_COMMUNITY): Payer: Self-pay | Admitting: General Practice

## 2018-04-06 ENCOUNTER — Inpatient Hospital Stay (HOSPITAL_COMMUNITY)
Admission: EM | Admit: 2018-04-06 | Discharge: 2018-04-10 | DRG: 190 | Disposition: A | Discharge status: Home Health | Payer: 59 | Attending: Internal Medicine | Admitting: Internal Medicine

## 2018-04-06 DIAGNOSIS — B9689 Other specified bacterial agents as the cause of diseases classified elsewhere: Secondary | ICD-10-CM

## 2018-04-06 DIAGNOSIS — J019 Acute sinusitis, unspecified: Secondary | ICD-10-CM

## 2018-04-06 DIAGNOSIS — F419 Anxiety disorder, unspecified: Secondary | ICD-10-CM | POA: Diagnosis not present

## 2018-04-06 DIAGNOSIS — Z7952 Long term (current) use of systemic steroids: Secondary | ICD-10-CM | POA: Diagnosis not present

## 2018-04-06 DIAGNOSIS — Z7951 Long term (current) use of inhaled steroids: Secondary | ICD-10-CM | POA: Diagnosis not present

## 2018-04-06 DIAGNOSIS — Z79899 Other long term (current) drug therapy: Secondary | ICD-10-CM | POA: Diagnosis not present

## 2018-04-06 DIAGNOSIS — J9621 Acute and chronic respiratory failure with hypoxia: Secondary | ICD-10-CM | POA: Diagnosis not present

## 2018-04-06 DIAGNOSIS — L508 Other urticaria: Secondary | ICD-10-CM | POA: Diagnosis not present

## 2018-04-06 DIAGNOSIS — M797 Fibromyalgia: Secondary | ICD-10-CM | POA: Diagnosis present

## 2018-04-06 DIAGNOSIS — I2510 Atherosclerotic heart disease of native coronary artery without angina pectoris: Secondary | ICD-10-CM

## 2018-04-06 DIAGNOSIS — F17210 Nicotine dependence, cigarettes, uncomplicated: Secondary | ICD-10-CM | POA: Diagnosis not present

## 2018-04-06 DIAGNOSIS — G4700 Insomnia, unspecified: Secondary | ICD-10-CM | POA: Diagnosis present

## 2018-04-06 DIAGNOSIS — R0602 Shortness of breath: Secondary | ICD-10-CM | POA: Diagnosis not present

## 2018-04-06 DIAGNOSIS — G4730 Sleep apnea, unspecified: Secondary | ICD-10-CM | POA: Diagnosis present

## 2018-04-06 DIAGNOSIS — Z881 Allergy status to other antibiotic agents status: Secondary | ICD-10-CM | POA: Diagnosis not present

## 2018-04-06 DIAGNOSIS — Z9981 Dependence on supplemental oxygen: Secondary | ICD-10-CM | POA: Diagnosis not present

## 2018-04-06 DIAGNOSIS — Z9861 Coronary angioplasty status: Secondary | ICD-10-CM

## 2018-04-06 DIAGNOSIS — J441 Chronic obstructive pulmonary disease with (acute) exacerbation: Secondary | ICD-10-CM | POA: Diagnosis not present

## 2018-04-06 DIAGNOSIS — Z888 Allergy status to other drugs, medicaments and biological substances status: Secondary | ICD-10-CM

## 2018-04-06 DIAGNOSIS — Z885 Allergy status to narcotic agent status: Secondary | ICD-10-CM | POA: Diagnosis not present

## 2018-04-06 DIAGNOSIS — J342 Deviated nasal septum: Secondary | ICD-10-CM

## 2018-04-06 DIAGNOSIS — J329 Chronic sinusitis, unspecified: Secondary | ICD-10-CM | POA: Diagnosis not present

## 2018-04-06 DIAGNOSIS — G629 Polyneuropathy, unspecified: Secondary | ICD-10-CM

## 2018-04-06 DIAGNOSIS — Z720 Tobacco use: Secondary | ICD-10-CM | POA: Diagnosis not present

## 2018-04-06 HISTORY — DX: Other complications of anesthesia, initial encounter: T88.59XA

## 2018-04-06 HISTORY — DX: Sleep apnea, unspecified: G47.30

## 2018-04-06 HISTORY — DX: Chronic obstructive pulmonary disease, unspecified: J44.9

## 2018-04-06 HISTORY — DX: Adverse effect of unspecified anesthetic, initial encounter: T41.45XA

## 2018-04-06 LAB — BASIC METABOLIC PANEL
Anion gap: 9 (ref 5–15)
BUN: 18 mg/dL (ref 6–20)
CHLORIDE: 101 mmol/L (ref 98–111)
CO2: 36 mmol/L — ABNORMAL HIGH (ref 22–32)
Calcium: 9.2 mg/dL (ref 8.9–10.3)
Creatinine, Ser: 0.7 mg/dL (ref 0.44–1.00)
GFR calc non Af Amer: >60 mL/min (ref >60)
Glucose, Bld: 84 mg/dL (ref 70–99)
POTASSIUM: 3.4 mmol/L — AB (ref 3.5–5.1)
SODIUM: 146 mmol/L — AB (ref 135–145)

## 2018-04-06 LAB — CBC WITH DIFFERENTIAL/PLATELET
ABS IMMATURE GRANULOCYTES: 0.2 10*3/uL — AB (ref 0.0–0.1)
BASOS ABS: 0 10*3/uL (ref 0.0–0.1)
Basophils Relative: 0 %
Eosinophils Absolute: 0.1 10*3/uL (ref 0.0–0.7)
Eosinophils Relative: 1 %
HCT: 48.7 % — ABNORMAL HIGH (ref 36.0–46.0)
HEMOGLOBIN: 15.1 g/dL — AB (ref 12.0–15.0)
IMMATURE GRANULOCYTES: 2 %
LYMPHS PCT: 19 %
Lymphs Abs: 2.8 10*3/uL (ref 0.7–4.0)
MCH: 30.4 pg (ref 26.0–34.0)
MCHC: 31 g/dL (ref 30.0–36.0)
MCV: 98 fL (ref 78.0–100.0)
Monocytes Absolute: 1.4 10*3/uL — ABNORMAL HIGH (ref 0.1–1.0)
Monocytes Relative: 10 %
NEUTROS ABS: 10 10*3/uL — AB (ref 1.7–7.7)
NEUTROS PCT: 68 %
Platelets: 306 10*3/uL (ref 150–400)
RBC: 4.97 MIL/uL (ref 3.87–5.11)
RDW: 13.2 % (ref 11.5–15.5)
WBC: 14.5 10*3/uL — AB (ref 4.0–10.5)

## 2018-04-06 LAB — BLOOD GAS, VENOUS
Acid-Base Excess: 8.1 mmol/L — ABNORMAL HIGH (ref 0.0–2.0)
Bicarbonate: 33.1 mmol/L — ABNORMAL HIGH (ref 20.0–28.0)
O2 Saturation: 91.7 %
PCO2 VEN: 56.1 mmHg (ref 44.0–60.0)
PO2 VEN: 64.2 mmHg — AB (ref 32.0–45.0)
Patient temperature: 98.6
pH, Ven: 7.389 (ref 7.250–7.430)

## 2018-04-06 LAB — TROPONIN I

## 2018-04-06 LAB — I-STAT TROPONIN, ED: TROPONIN I, POC: 0.01 ng/mL (ref 0.00–0.08)

## 2018-04-06 LAB — BRAIN NATRIURETIC PEPTIDE: B NATRIURETIC PEPTIDE 5: 98.4 pg/mL (ref 0.0–100.0)

## 2018-04-06 MED ORDER — PREGABALIN 100 MG PO CAPS
300.0000 mg | ORAL_CAPSULE | Freq: Two times a day (BID) | ORAL | Status: DC
  Administered 2018-04-06 – 2018-04-07 (×3): 300 mg via ORAL
  Filled 2018-04-06: qty 6
  Filled 2018-04-06 (×2): qty 3

## 2018-04-06 MED ORDER — ENOXAPARIN SODIUM 40 MG/0.4ML ~~LOC~~ SOLN
40.0000 mg | SUBCUTANEOUS | Status: DC
  Administered 2018-04-06 – 2018-04-09 (×3): 40 mg via SUBCUTANEOUS
  Filled 2018-04-06 (×4): qty 0.4

## 2018-04-06 MED ORDER — IPRATROPIUM-ALBUTEROL 0.5-2.5 (3) MG/3ML IN SOLN
3.0000 mL | Freq: Four times a day (QID) | RESPIRATORY_TRACT | Status: DC
  Administered 2018-04-06 – 2018-04-09 (×14): 3 mL via RESPIRATORY_TRACT
  Filled 2018-04-06 (×13): qty 3

## 2018-04-06 MED ORDER — PREDNISONE 20 MG PO TABS
40.0000 mg | ORAL_TABLET | Freq: Every day | ORAL | Status: DC
  Administered 2018-04-07 – 2018-04-10 (×4): 40 mg via ORAL
  Filled 2018-04-06 (×4): qty 2

## 2018-04-06 MED ORDER — FLUTICASONE FUROATE-VILANTEROL 200-25 MCG/INH IN AEPB
1.0000 | INHALATION_SPRAY | Freq: Every day | RESPIRATORY_TRACT | Status: DC
  Filled 2018-04-06: qty 28

## 2018-04-06 MED ORDER — ALBUTEROL SULFATE (2.5 MG/3ML) 0.083% IN NEBU
INHALATION_SOLUTION | RESPIRATORY_TRACT | Status: AC
  Filled 2018-04-06: qty 6

## 2018-04-06 MED ORDER — METHYLPREDNISOLONE SODIUM SUCC 125 MG IJ SOLR
125.0000 mg | Freq: Once | INTRAMUSCULAR | Status: AC
  Administered 2018-04-06: 125 mg via INTRAVENOUS
  Filled 2018-04-06: qty 2

## 2018-04-06 MED ORDER — NICOTINE 14 MG/24HR TD PT24
14.0000 mg | MEDICATED_PATCH | TRANSDERMAL | Status: DC
  Administered 2018-04-06 – 2018-04-09 (×4): 14 mg via TRANSDERMAL
  Filled 2018-04-06 (×3): qty 1

## 2018-04-06 MED ORDER — NICOTINE 21 MG/24HR TD PT24: 21.0000 mg | MEDICATED_PATCH | Freq: Every day | TRANSDERMAL | Status: DC

## 2018-04-06 MED ORDER — ACETAMINOPHEN 500 MG PO TABS
1000.0000 mg | ORAL_TABLET | Freq: Once | ORAL | Status: AC
  Administered 2018-04-06: 1000 mg via ORAL
  Filled 2018-04-06: qty 2

## 2018-04-06 MED ORDER — ALBUTEROL SULFATE (2.5 MG/3ML) 0.083% IN NEBU
2.5000 mg | INHALATION_SOLUTION | RESPIRATORY_TRACT | Status: DC | PRN
  Administered 2018-04-06 – 2018-04-10 (×3): 2.5 mg via RESPIRATORY_TRACT
  Filled 2018-04-06 (×3): qty 3

## 2018-04-06 MED ORDER — ALPRAZOLAM 0.25 MG PO TABS
0.5000 mg | ORAL_TABLET | Freq: Once | ORAL | Status: AC
  Administered 2018-04-06: 0.5 mg via ORAL
  Filled 2018-04-06: qty 2

## 2018-04-06 MED ORDER — KETOROLAC TROMETHAMINE 30 MG/ML IJ SOLN
30.0000 mg | Freq: Once | INTRAMUSCULAR | Status: AC
  Administered 2018-04-06: 30 mg via INTRAVENOUS
  Filled 2018-04-06: qty 1

## 2018-04-06 MED ORDER — LEVOFLOXACIN 500 MG PO TABS
500.0000 mg | ORAL_TABLET | Freq: Every day | ORAL | Status: DC
  Administered 2018-04-06 – 2018-04-10 (×5): 500 mg via ORAL
  Filled 2018-04-06 (×5): qty 1

## 2018-04-06 MED ORDER — PREGABALIN 100 MG PO CAPS
300.0000 mg | ORAL_CAPSULE | Freq: Two times a day (BID) | ORAL | Status: DC
  Filled 2018-04-06: qty 3

## 2018-04-06 MED ORDER — POTASSIUM CHLORIDE CRYS ER 20 MEQ PO TBCR
40.0000 meq | EXTENDED_RELEASE_TABLET | Freq: Once | ORAL | Status: AC
  Administered 2018-04-06: 40 meq via ORAL
  Filled 2018-04-06: qty 2

## 2018-04-06 MED ORDER — ORAL CARE MOUTH RINSE
15.0000 mL | Freq: Two times a day (BID) | OROMUCOSAL | Status: DC
Start: 2018-04-06 — End: 2018-04-10
  Administered 2018-04-06 – 2018-04-09 (×6): 15 mL via OROMUCOSAL

## 2018-04-06 MED ORDER — ALBUTEROL (5 MG/ML) CONTINUOUS INHALATION SOLN
10.0000 mg/h | INHALATION_SOLUTION | Freq: Once | RESPIRATORY_TRACT | Status: AC
  Administered 2018-04-06: 10 mg/h via RESPIRATORY_TRACT
  Filled 2018-04-06: qty 20

## 2018-04-06 MED ORDER — ALPRAZOLAM 0.5 MG PO TABS
0.5000 mg | ORAL_TABLET | Freq: Two times a day (BID) | ORAL | Status: DC | PRN
  Administered 2018-04-06 – 2018-04-09 (×4): 0.5 mg via ORAL
  Filled 2018-04-06 (×5): qty 1

## 2018-04-06 MED ORDER — ZOLPIDEM TARTRATE 5 MG PO TABS
5.0000 mg | ORAL_TABLET | Freq: Every evening | ORAL | Status: DC | PRN
  Administered 2018-04-06 – 2018-04-09 (×4): 5 mg via ORAL
  Filled 2018-04-06 (×4): qty 1

## 2018-04-06 MED ORDER — ALBUTEROL SULFATE (2.5 MG/3ML) 0.083% IN NEBU
5.0000 mg | INHALATION_SOLUTION | Freq: Once | RESPIRATORY_TRACT | Status: AC
  Administered 2018-04-06: 5 mg via RESPIRATORY_TRACT

## 2018-04-06 MED ORDER — NICOTINE 14 MG/24HR TD PT24
14.0000 mg | MEDICATED_PATCH | Freq: Every day | TRANSDERMAL | Status: DC
  Filled 2018-04-06: qty 1

## 2018-04-06 MED ORDER — PANTOPRAZOLE SODIUM 40 MG PO TBEC
40.0000 mg | DELAYED_RELEASE_TABLET | Freq: Every day | ORAL | Status: DC
  Administered 2018-04-06 – 2018-04-10 (×5): 40 mg via ORAL
  Filled 2018-04-06 (×5): qty 1

## 2018-04-06 NOTE — ED Notes (Signed)
Attempted to give report to Alexi, RN ; RN not available to take report at this time , Rn states" I am in a patients room right now I will call you back in about 10 min"

## 2018-04-06 NOTE — ED Provider Notes (Signed)
[facility] EMERGENCY DEPARTMENT Provider Note   CSN: [id number] Arrival date & time: 04/06/18  0415     History   Chief Complaint Chief Complaint  Patient presents with  . Shortness of Breath    HPI Tracey Randolph is a 52 y.o. female.  Patient is a 52 year old female with past medical history of COPD, anxiety, fibromyalgia.  She presents today for evaluation of shortness of breath.  This is been worsening over the past 2 weeks.  She was recently admitted at [facility] with similar complaints.  She was feeling better when she left, however her breathing is worsened.  She reports congestion in her chest and having difficulty moving air.  She denies fevers, chills, or productive cough.  She denies chest pain.  She is on home oxygen at 2 L, but still feels short of breath.  She continues to smoke cigarettes.  The history is provided by the patient.  Shortness of Breath  This is a recurrent problem. The average episode lasts 2 weeks. The problem occurs continuously.The problem has been gradually worsening. Associated symptoms include rhinorrhea and cough. Pertinent negatives include no fever, no sputum production, no chest pain, no leg pain and no leg swelling. She has tried nothing for the symptoms.    Past Medical History:  Diagnosis Date  . Anxiety   . Fibromyalgia   . H/O cystoscopy   . Hip arthritis   . Respiratory failure [facility])    [facility]  . Sciatic nerve pain   . Stress-induced cardiomyopathy    [facility]   . Urticaria     Patient Active Problem List   Diagnosis Date Noted  . Vocal cord dysfunction 06/05/2015  . Moderate persistent asthma 06/05/2015  . Other allergic rhinitis 06/05/2015  . Idiopathic urticaria 06/05/2015  . Sleep apnea 06/05/2015  . Laryngopharyngeal reflux (LPR) 06/05/2015  . Long term current use of systemic steroids 06/05/2015  . Paresthesias 07/01/2013    Past Surgical History:  Procedure  Laterality Date  . BREAST SURGERY    . CARPAL TUNNEL RELEASE Right   . CESAREAN SECTION    . HERNIA REPAIR  [id number]  . TUBAL LIGATION       OB History   None      Home Medications    Prior to Admission medications   Medication Sig Start Date End Date Taking? Authorizing Provider  albuterol (PROVENTIL) (2.5 MG/3ML) 0.083% nebulizer solution Take 2.5 mg by nebulization every 6 (six) hours as needed for wheezing or shortness of breath.   Yes [provider]  ALPRAZolam Prudy Feeler) 0.5 MG tablet Take 0.5 mg by mouth 2 (two) times daily as needed for anxiety.   Yes [provider]  Ascorbic Acid (VITAMIN C PO) Take 1 tablet by mouth daily.   Yes [provider]  budesonide (PULMICORT) 0.25 MG/2ML nebulizer solution Take 0.25 mg by nebulization 2 (two) times daily as needed (SOB).   Yes [provider]  cetirizine (ZYRTEC) 10 MG tablet Take 10 mg by mouth daily.   Yes [provider]  Cholecalciferol (VITAMIN D PO) Take 1 tablet by mouth daily.   Yes [provider]  COPPER PO Take 1 tablet by mouth daily.   Yes [provider]  Cyanocobalamin (VITAMIN B-12 PO) Take 1 tablet by mouth daily.   Yes [provider]  ferrous sulfate 325 (65 FE) MG tablet Take 325 mg by mouth daily with breakfast.   Yes [provider]  fluconazole (DIFLUCAN) 100 MG tablet Take 100-200 mg by mouth daily. Take 200 mg on the first day and then 100 mg for the next 4 days   Yes [provider]  MAGNESIUM PO Take 1 tablet by mouth daily.   Yes [provider]  predniSONE (DELTASONE) 20 MG tablet Take 20-60 mg by mouth daily with breakfast. Take 3 tablets daily for 2 days, then 2 tablets for 2 days, then 1 tablet daily.   Yes [provider]  pregabalin (LYRICA) 100 MG capsule Take 300 mg by mouth 3 (three) times daily.   Yes [provider]  PROAIR HFA 108 (90 Base) MCG/ACT inhaler INHALE 2 PUFFS INTO THE  LUNGS EVERY 4 (FOUR) HOURS AS NEEDED FOR WHEEZING OR SHORTNESS OF BREATH. 10/02/15  Yes Kozlow, Alvira Philips, MD  Pyridoxine HCl (VITAMIN B-6 PO) Take 1 tablet by mouth daily.   Yes [provider]  senna-docusate (SENEXON-S) 8.6-50 MG tablet TAKE 2 TABLETS BY MOUTH EVERY DAY AS NEEDED FOR CONSTIPATION 12/07/15  Yes [provider]  VITAMIN A OP Apply 1 tablet to eye daily.   Yes [provider]  VITAMIN E PO Take 1 tablet by mouth daily.   Yes [provider]  zolpidem (AMBIEN) 10 MG tablet Take 10 mg by mouth at bedtime as needed for sleep.   Yes [provider]    Family History Family History  Problem Relation Age of Onset  . Healthy Mother        Living  . Other Father        Multiorgan failure  . Heart Problems Brother        Deceased  . Heart attack Maternal Grandfather   . Charcot-Marie-Tooth disease Brother   . Heart attack Brother   . Healthy Son        x 2  . Healthy Daughter        x 1    Social History Social History   Tobacco Use  . Smoking status: Former Smoker    Packs/day: 2.00    Years: 25.00    Pack years: 50.00    Types: Cigarettes  . Smokeless tobacco: Never Used  . Tobacco comment: 1to 1 1/2 daily, Quit December 2015  Substance Use Topics  . Alcohol use: No    Alcohol/week: 0.0 oz  . Drug use: No     Allergies   Dulera [mometasone furo-formoterol fum]; Morphine sulfate; Cefuroxime; and Morphine   Review of Systems Review of Systems  Constitutional: Negative for fever.  HENT: Positive for rhinorrhea.   Respiratory: Positive for cough and shortness of breath. Negative for sputum production.   Cardiovascular: Negative for chest pain and leg swelling.  All other systems reviewed and are negative.    Physical Exam Updated Vital Signs BP (!) 139/92   Pulse 84   Temp 98.1 F (36.7 C) (Oral)   Resp 17   SpO2 93%   Physical Exam  Constitutional: She is oriented to person, place, and time. She appears  well-developed and well-nourished. No distress.  HENT:  Head: Normocephalic and atraumatic.  Neck: Normal range of motion. Neck supple.  Cardiovascular: Normal rate and regular rhythm. Exam reveals no gallop and no friction rub.  No murmur heard. Pulmonary/Chest: Effort normal. No respiratory distress. She has wheezes in the right middle field and the left middle field.  There are expiratory wheezes bilaterally with decreased air movement.  Abdominal: Soft. Bowel sounds are normal. She exhibits no distension.  There is no tenderness.  Musculoskeletal: Normal range of motion.       Right lower leg: Normal. She exhibits no tenderness and no edema.       Left lower leg: Normal. She exhibits no tenderness and no edema.  Neurological: She is alert and oriented to person, place, and time.  Skin: Skin is warm and dry. She is not diaphoretic.  Nursing note and vitals reviewed.    ED Treatments / Results  Labs (all labs ordered are listed, but only abnormal results are displayed) Labs Reviewed  BASIC METABOLIC PANEL - Abnormal; Notable for the following components:      Result Value   Sodium 146 (*)    Potassium 3.4 (*)    CO2 36 (*)    All other components within normal limits  CBC WITH DIFFERENTIAL/PLATELET - Abnormal; Notable for the following components:   WBC 14.5 (*)    Hemoglobin 15.1 (*)    HCT 48.7 (*)    Neutro Abs 10.0 (*)    Monocytes Absolute 1.4 (*)    Abs Immature Granulocytes 0.2 (*)    All other components within normal limits  BRAIN NATRIURETIC PEPTIDE  I-STAT TROPONIN, ED    EKG EKG Interpretation  Date/Time:  Friday April 06 2018 05:13:49 EDT Ventricular Rate:  83 PR Interval:    QRS Duration: 94 QT Interval:  352 QTC Calculation: 414 R Axis:   96 Text Interpretation:  Sinus rhythm Borderline short PR interval Probable lateral infarct, old Confirmed by Geoffery Lyons ([id number]) on 04/06/2018 5:29:34 AM Also confirmed by Geoffery Lyons ([id number]), editor Elita Quick [id number])  on 04/06/2018 6:42:53 AM   Radiology Dg Chest 2 View  Result Date: 04/06/2018 CLINICAL DATA:  Shortness of breath EXAM: CHEST - 2 VIEW COMPARISON:  03/28/2018 FINDINGS: Normal heart size and pulmonary vascularity. No focal airspace disease or consolidation in the lungs. No blunting of costophrenic angles. No pneumothorax. Mediastinal contours appear intact. IMPRESSION: No active cardiopulmonary disease. Electronically Signed   By: Burman Nieves M.D.   On: 04/06/2018 05:01    Procedures Procedures (including critical care time)  Medications Ordered in ED Medications  albuterol (PROVENTIL) (2.5 MG/3ML) 0.083% nebulizer solution (  Not Given 04/06/18 0533)  albuterol (PROVENTIL) (2.5 MG/3ML) 0.083% nebulizer solution 5 mg (5 mg Nebulization Given 04/06/18 0430)  methylPREDNISolone sodium succinate (SOLU-MEDROL) 125 mg/2 mL injection 125 mg (125 mg Intravenous Given 04/06/18 0553)  albuterol (PROVENTIL,VENTOLIN) solution continuous neb (10 mg/hr Nebulization Given 04/06/18 0533)  acetaminophen (TYLENOL) tablet 1,000 mg (1,000 mg Oral Given 04/06/18 2130)     Initial Impression / Assessment and Plan / ED Course  I have reviewed the triage vital signs and the nursing notes.  Pertinent labs & imaging results that were available during my care of the patient were reviewed by me and considered in my medical decision making (see chart for details).  Patient with history of COPD presenting with shortness of breath.  She was given steroids and an hour-long nebulizer treatment, however she continues to have wheezing and difficulty breathing.  She was ambulated in the hallway and tolerated this poorly.  When she returned to the bed, her oxygen saturations were 88% while on her normal oxygen.  The remainder of her work-up is unremarkable including EKG and chest x-ray.  I suspect that she is experiencing an exacerbation of COPD and will require inpatient therapy.  Internal medicine teaching  service will be consulted for the admission.  CRITICAL CARE Performed by: Riley Lam  Laniya Friedl Total critical care time: 35 minutes Critical care time was exclusive of separately billable procedures and treating other patients. Critical care was necessary to treat or prevent imminent or life-threatening deterioration. Critical care was time spent personally by me on the following activities: development of treatment plan with patient and/or surrogate as well as nursing, discussions with consultants, evaluation of patient's response to treatment, examination of patient, obtaining history from patient or surrogate, ordering and performing treatments and interventions, ordering and review of laboratory studies, ordering and review of radiographic studies, pulse oximetry and re-evaluation of patient's condition.   Final Clinical Impressions(s) / ED Diagnoses   Final diagnoses:  None    ED Discharge Orders    None       Geoffery Lyonselo, Johnae Friley, MD 04/06/18 [phone]

## 2018-04-06 NOTE — ED Notes (Addendum)
Dr. Obie DredgeBlum at bedside and aware of 02 levels 88%-92% on 2L ; pt's o2 levels will drop with activity ; Per Dr. Obie DredgeBlum keep at 2L of 02 to maintain 02 sats at 90-94% increase by 0.5 L if needed

## 2018-04-06 NOTE — ED Triage Notes (Signed)
Patient c/o SOB. [facility] States that her oxygen machine at home was "messed for two days" and has been declining since then.

## 2018-04-06 NOTE — H&P (Signed)
Date: 04/06/2018               Patient Name:  Tracey Randolph MRN: [id number]  DOB: 09/23/1966 Age / Sex: 52 y.o., female   PCP: Eloisa NorthernAmin, Saad, MD         Medical Service: Internal Medicine Teaching Service         Attending Physician: Dr. Burns SpainButcher, Elizabeth A, MD    First Contact: Mr. [name]  Pager: [phone]  Second Contact: Dr. Samuella CotaSvalina Pager: [phone]       After Hours (After 5p/  First Contact Pager: [phone]  weekends / holidays): Second Contact Pager: [phone]   Chief Complaint: shortness of breath   History of Present Illness:  52 year old woman with history of COPD ( on home oxygen 0- 2 liters, last PFTs not available at this time), chronic urticaria (on prednisone 20 mg daily) and anxiety.   Admit to [place] hospital last Tuesday - Friday with symptoms of sinus pressure, productive cough and shortness of breath. She was treated with breathing treatments, steroids, and antibiotics, oxygen was 94% on room air at the time of discharge and her work of breathing felt improved. She was sent home with two additional days of antibiotics ( not sure of the name at this time) which she completed. Pulse ox at home was consistently 81 -85% despite being on home 2 liters. The company which services her home oxygen evaluated her machine on Tuesday and found that the machine wasn't working properly. Tuesday afternoon she saw her pulmonologist received an IM steroid injection and then returned home. On Wednesday they fixed the home oxygen machine. Breathing continued to worsen, she turned the machine up to 3 L and then her oxygen saturation was maintained in the low 90s.   Daughter and granddaughter were sick with symptoms of URI the week prior to her symptoms starting. She is having symptoms of sinus pain and pressure, headache, chest tightness with exertion, weakness, and fatigue. She has a cough, she does not have a cough on a regular basis. The cough is productive of clear sputum. She  has been using albuterol 15-20 times daily since returning home from the outside hospital.   Last COPD exacerbation requiring hospitalization was April 2018. She has had recurrent symptoms of sinusitis since that time and has associated worsening of her breathing, this has happened twice in the past 6 months. Usually she goes to her pulmonologist and gets IM steroids and a course of antibiotics.   When she attempted to ambulate in the ED she desated to 88% on 2 liters of oxygen.  On good days she is able to swim and walk around in her yard, she feels mild shortness of breath with these activities and has to stop and rest at times. On a good day she is able to walk through the entire grocery store without needing to stop and rest.   Meds:  Current Facility-Administered Medications for the 04/06/18 encounter [facility](Hospital Encounter)  Medication  . omalizumab Geoffry Paradise(XOLAIR) injection 300 mg   Current Meds  Medication Sig  . albuterol (PROVENTIL) (2.5 MG/3ML) 0.083% nebulizer solution Take 2.5 mg by nebulization every 6 (six) hours as needed for wheezing or shortness of breath.  . ALPRAZolam (XANAX) 0.5 MG tablet Take 0.5 mg by mouth 2 (two) times daily as needed for anxiety.  . Ascorbic Acid (VITAMIN C PO) Take 1 tablet by mouth daily.  . budesonide (PULMICORT) 0.25 MG/2ML nebulizer solution Take 0.25 mg  by nebulization 2 (two) times daily as needed (SOB).  . cetirizine (ZYRTEC) 10 MG tablet Take 10 mg by mouth daily.  . Cholecalciferol (VITAMIN D PO) Take 1 tablet by mouth daily.  . COPPER PO Take 1 tablet by mouth daily.  . Cyanocobalamin (VITAMIN B-12 PO) Take 1 tablet by mouth daily.  . ferrous sulfate 325 (65 FE) MG tablet Take 325 mg by mouth daily with breakfast.  . fluconazole (DIFLUCAN) 100 MG tablet Take 100-200 mg by mouth daily. Take 200 mg on the first day and then 100 mg for the next 4 days  . MAGNESIUM PO Take 1 tablet by mouth daily.  . predniSONE (DELTASONE) 20 MG tablet Take 20-60 mg by  mouth daily with breakfast. Take 3 tablets daily for 2 days, then 2 tablets for 2 days, then 1 tablet daily.  . pregabalin (LYRICA) 100 MG capsule Take 300 mg by mouth 3 (three) times daily.  Marland Kitchen PROAIR HFA 108 (90 Base) MCG/ACT inhaler INHALE 2 PUFFS INTO THE LUNGS EVERY 4 (FOUR) HOURS AS NEEDED FOR WHEEZING OR SHORTNESS OF BREATH.  Marland Kitchen Pyridoxine HCl (VITAMIN B-6 PO) Take 1 tablet by mouth daily.  Marland Kitchen senna-docusate (SENEXON-S) 8.6-50 MG tablet TAKE 2 TABLETS BY MOUTH EVERY DAY AS NEEDED FOR CONSTIPATION  . VITAMIN A OP Apply 1 tablet to eye daily.  Marland Kitchen VITAMIN E PO Take 1 tablet by mouth daily.  Marland Kitchen zolpidem (AMBIEN) 10 MG tablet Take 10 mg by mouth at bedtime as needed for sleep.     Allergies: Allergies as of 04/06/2018 - Review Complete 04/06/2018  Allergen Reaction Noted  . Dulera [mometasone furo-formoterol fum] Hives 12/28/2015  . Morphine sulfate  12/28/2015  . Cefuroxime Rash 11/05/2014  . Morphine Rash 11/05/2014   Past Medical History:  Diagnosis Date  . Anxiety   . Complication of anesthesia    " i HAVE A HARD TIME WAKING UP "  . COPD (chronic obstructive pulmonary disease) (HCC)   . Fibromyalgia   . H/O cystoscopy   . Hip arthritis   . Respiratory failure [facility])    [facility]  . Sciatic nerve pain   . Sleep apnea   . Stress-induced cardiomyopathy    [facility]   . Urticaria     Family History:  Paternal Grandfather passed from heart failure Father died at age 4 from "multiorgan system failure" he is unsure of the underlying cause Brother died at age 82 "quit breathing" She has 2 additional brothers who are alive and have lower extremity pain with walking  Social History:  She smokes cigarettes since age 27, 2 packs/day at most but is now down to less than half a pack per day.  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  He spends her days with her one  daughter, 2 sons, 2 granddaughters, and one grandson.  She lives with her husband who is currently working.  Does not drink alcohol and denies illicit drug use.  Review of Systems: A complete ROS was negative except as per HPI.   Physical Exam: Blood pressure (!) 148/92, pulse 89, temperature 98.3 F (36.8 C), temperature source Oral, resp. rate 20, height 5\' 3"  (1.6 m), weight 126 lb 8.7 oz (57.4 kg), SpO2 90 %. General: Chronically ill-appearing, appears uncomfortable HEENT: The eyes are nonicteric, nonerythematous sclera, the nasal turbinates are white and dry, there is tenderness to  palpation over the frontal sinuses Neck: There is no palpable lymphadenopathy or thyroid enlargement Cardiac: Regular rate and rhythm, no murmurs appreciated, no peripheral edema, no JVD  Pulmonary: Accessory muscle use with abdominal breathing, speaking in broken sentences, lung sounds are distant, early inspiratory and expiratory wheeze throughout all lung fields GI: Bowel sounds are delayed, the abdomen soft, nontender, nondistended  EKG: personally reviewed my interpretation is sinus rhythm, heart rate rate 84, left axis deviation, no acute ST or T wave changes  CXR: personally reviewed my interpretation is lungs are hyperinflated, there is no focal opacity or pulmonary edema  Assessment & Plan by Problem: Principal Problem:   COPD exacerbation (HCC) Active Problems:   Long term current use of systemic steroids  COPD exacerbation ? 2/2 Bacterial sinusitis  Symptoms of new cough which is productive and increased oxygen requirement. Associated with symptoms of sinusitis which have been ongoing now for two weeks and chest tightness. Home meds include Symbicort, PRN budesonide nebs and salbuterol nebs PRN. She has been on prednisone 20 mg daily relatively consistently for the past year for for chronic urticaria. CXR and exam are not consistent with volume overload or pneumonia.  - bicarb 36, will obtain  VBG  - will need influenza and pneumonia vaccination series outpatient  - scheduled duoneb q6h  - levofloxacin for treatment of COPD exacerbation and bacterial sinusitis as she has an allergy to cephalosporins noted as rash in the chart   - supplemental oxygen to keep oxygen saturation 90-94% (currently she is needing about 2.5 liters)  - will use breo as an alternative to home symbicort which is not on formulary  - start prednisone 40 mg daily for 5 days  - protonix 40 mg daily for GI ppx given chronic steroid use  - will continue to encourage tobacco cessation  - follow up delta trop   Anxiety  - continue home ativan 0.5 mg BID PRN   Insomnia  - continue home ambien 5 mg qhs   Neuropathy  - continue home lyrica   Dispo: Admit patient to Inpatient with expected length of stay greater than 2 midnights.  Signed: Eulah Pont, MD 04/06/2018, 12:34 PM  Pager: [phone]

## 2018-04-06 NOTE — ED Notes (Signed)
Pt was walked by techs, pt felt short of breath while ambulating and oxygen levels went down to 88% MD aware

## 2018-04-06 NOTE — ED Notes (Signed)
Attempted IV x2 - unsuccessful - patient is an extremely hard stick; Md and IV team notified.

## 2018-04-07 DIAGNOSIS — J329 Chronic sinusitis, unspecified: Secondary | ICD-10-CM

## 2018-04-07 LAB — CBC
HCT: 45.4 % (ref 36.0–46.0)
Hemoglobin: 14.2 g/dL (ref 12.0–15.0)
MCH: 30 pg (ref 26.0–34.0)
MCHC: 31.3 g/dL (ref 30.0–36.0)
MCV: 96 fL (ref 78.0–100.0)
PLATELETS: 293 10*3/uL (ref 150–400)
RBC: 4.73 MIL/uL (ref 3.87–5.11)
RDW: 12.8 % (ref 11.5–15.5)
WBC: 13.9 10*3/uL — ABNORMAL HIGH (ref 4.0–10.5)

## 2018-04-07 LAB — BASIC METABOLIC PANEL
Anion gap: 9 (ref 5–15)
BUN: 24 mg/dL — ABNORMAL HIGH (ref 6–20)
CO2: 32 mmol/L (ref 22–32)
CREATININE: 0.64 mg/dL (ref 0.44–1.00)
Calcium: 8.8 mg/dL — ABNORMAL LOW (ref 8.9–10.3)
Chloride: 103 mmol/L (ref 98–111)
GFR calc non Af Amer: >60 mL/min (ref >60)
Glucose, Bld: 110 mg/dL — ABNORMAL HIGH (ref 70–99)
Potassium: 4.2 mmol/L (ref 3.5–5.1)
Sodium: 144 mmol/L (ref 135–145)

## 2018-04-07 LAB — HIV ANTIBODY (ROUTINE TESTING W REFLEX): HIV Screen 4th Generation wRfx: NONREACTIVE

## 2018-04-07 MED ORDER — FLUTICASONE PROPIONATE 50 MCG/ACT NA SUSP
2.0000 | Freq: Every day | NASAL | Status: DC
  Administered 2018-04-07 – 2018-04-08 (×2): 2 via NASAL
  Filled 2018-04-07: qty 16

## 2018-04-07 MED ORDER — ACETAMINOPHEN 325 MG PO TABS
650.0000 mg | ORAL_TABLET | Freq: Four times a day (QID) | ORAL | Status: DC | PRN
  Administered 2018-04-07 – 2018-04-10 (×5): 650 mg via ORAL
  Filled 2018-04-07 (×5): qty 2

## 2018-04-07 MED ORDER — SALINE SPRAY 0.65 % NA SOLN
1.0000 | NASAL | Status: DC
  Administered 2018-04-07 – 2018-04-10 (×19): 1 via NASAL
  Filled 2018-04-07: qty 44

## 2018-04-07 MED ORDER — PREGABALIN 100 MG PO CAPS
300.0000 mg | ORAL_CAPSULE | Freq: Three times a day (TID) | ORAL | Status: DC
  Administered 2018-04-07 – 2018-04-08 (×3): 300 mg via ORAL
  Filled 2018-04-07 (×3): qty 3

## 2018-04-07 MED ORDER — DM-GUAIFENESIN ER 30-600 MG PO TB12
1.0000 | ORAL_TABLET | Freq: Two times a day (BID) | ORAL | Status: DC
  Administered 2018-04-07 – 2018-04-10 (×7): 1 via ORAL
  Filled 2018-04-07 (×7): qty 1

## 2018-04-07 NOTE — Progress Notes (Addendum)
Subjective:  [name] denies any improvement in her symptomatology this morning. She is still having difficulty breathing and finds it more difficult to do tasks such as getting up to go to the bathroom than she does at baseline. She also complains of continued symptoms of her sinus infection, which she states have been present in episodes over the last year. She feels congested and has purulent sputum discharging from her nose. She stops several times during the interview to cough forcefully.    Objective:  Vital signs in last 24 hours: Vitals:   04/06/18 2016 04/06/18 2247 04/07/18 0749 04/07/18 0927  BP: (!) 140/99 (!) 145/91  133/82  Pulse: 99 97  90  Resp:  20  20  Temp: 98.3 F (36.8 C) 97.7 F (36.5 C)  98 F (36.7 C)  TempSrc: Oral Oral  Oral  SpO2:   94% 95%  Weight:      Height:       Weight change:   Intake/Output Summary (Last 24 hours) at 04/07/2018 1231 Last data filed at 04/07/2018 [id number] Gross per 24 hour  Intake 240 ml  Output 800 ml  Net -560 ml   Physical Exam: General: chronically ill appearing woman in no acute distress  Pulm: diffuse expiratory wheezes, poor air movement lower> upper lung fields, fine crackles present throughout lower> upper lung fields, patient coughs during deep inhalation on exam CV: RRR, nMRG, no peripheral edema Abd: BS+ non-tender, non-distended  Neuro: no gross motor or sensory deficits   Assessment/Plan:  Principal Problem:   COPD exacerbation (HCC) Active Problems:   Long term current use of systemic steroids  [name] is a 52 year old woman with a history of COPD (on home O2 2L prn) chronic urticaria and anxiety. She presents a week after being discharged from an OSH with SOB, presumably due to an exacerbation of her underlying COPD and sinus pressure/ congestion.  COPD Exacerbation Secondary to Sinusitis Acute on chronic hypoxic respiratory failure: [name] presents with dyspnea, cough, and hypoxia secondary to an  exacerbation of previously diagnosed COPD, on 2L O2 PRN prescribed by her pulmonologist. She presents with a productive and to a flare of a sinus infection, which she has struggled with for the last year. These symptoms could represent a bacterial or viral etiology that has precipitated these consecutive exacerbations of her otherwise relatively well-controlled COPD (last hospitalization April 2018). Chest xray did not suggest pneumonia as etiology and diffuse wheezes vs crackles on lung exam suggest COPD vs fluid overload. VBG on admission does not reveal significant acid/base disturbance- pH 7.38, CO2 56, HCO3 33.1- likely chronic considering pH in normal range -Duoneb q6  -Levoflaxacin- tx COPD exacerbation and potential bacterial sinusitis (pt with allergy to cephalosporins); had been on azithromycin within the last week  -prednisone 40 mg qd  -O2 Sat goal 90-94%- supplemental O2 as needed; wean as able -Flonase, Mucinex and nasal saline spray as decongestants   Anxiety:  Continue home ativan 0.5 mg BID PRN  Insomnia:  Continue home Ambien 5mg  qhs   Neuropathy:  -continue home Lyrica 300 TID    FEN/GI: thin fluid diet, monitor electrolytes and replete as needed  Steroid ppx: Protonix 40 mg  Ppx: Lovenox, Protonix 40 mg    LOS: 1 day   Lindie Spruceuningham, Tracey, Medical Student 04/07/2018, 12:31 PM   Attestation for Student Documentation:  I personally was present and performed or re-performed the history, physical exam and medical decision-making activities of this service and  have verified that the service and findings are accurately documented in the student's note.  Nyra Market, MD 04/07/2018, 1:27 PM [phone]  Internal Medicine Attending  Date: 04/07/2018  Patient name: Tracey [facility] Medical record number: [id number] Date of birth: 25-Mar-1966 Age: 52 y.o. Gender: female  I discussed the interim history with Dr. Samuella Cota and reviewed her progress note.  I agree with the  resident's findings and plans.

## 2018-04-08 DIAGNOSIS — J019 Acute sinusitis, unspecified: Secondary | ICD-10-CM

## 2018-04-08 DIAGNOSIS — B9689 Other specified bacterial agents as the cause of diseases classified elsewhere: Secondary | ICD-10-CM

## 2018-04-08 MED ORDER — PREGABALIN 100 MG PO CAPS: 300.0000 mg | ORAL_CAPSULE | Freq: Three times a day (TID) | ORAL | Status: DC

## 2018-04-08 MED ORDER — PREGABALIN 100 MG PO CAPS
300.0000 mg | ORAL_CAPSULE | Freq: Three times a day (TID) | ORAL | Status: AC
  Administered 2018-04-08 (×2): 300 mg via ORAL
  Filled 2018-04-08: qty 3
  Filled 2018-04-08: qty 6

## 2018-04-08 MED ORDER — PREGABALIN 100 MG PO CAPS
300.0000 mg | ORAL_CAPSULE | Freq: Three times a day (TID) | ORAL | Status: DC
  Administered 2018-04-09 – 2018-04-10 (×5): 300 mg via ORAL
  Filled 2018-04-08 (×2): qty 3
  Filled 2018-04-08: qty 6
  Filled 2018-04-08 (×2): qty 3

## 2018-04-08 MED ORDER — FLUTICASONE PROPIONATE 50 MCG/ACT NA SUSP
2.0000 | Freq: Two times a day (BID) | NASAL | Status: DC
  Administered 2018-04-08 – 2018-04-10 (×4): 2 via NASAL
  Filled 2018-04-08: qty 16

## 2018-04-08 NOTE — Progress Notes (Signed)
Internal Medicine Attending  Date: 04/08/2018  Patient name: Tracey MarketChristina C Gorczyca Medical record number: [id number] Date of birth: 10/04/1965 Age: 52 y.o. Gender: female  I saw and evaluated the patient. I reviewed the resident's note by Dr. Samuella CotaSvalina and I agree with the resident's findings and plans as documented in her progress note.  When seen on rounds this morning Ms. [name] was not clear if she was ready for discharge home. That said, she did feel she was improved from a pulmonary symptom standpoint. We will continue with the prednisone burst, bronchodilators, and Levaquin for her sinusitis which is felt to be the cause of her COPD exacerbation. I anticipate she will continue to improve and be ready for discharge within the next 24-72 hours.

## 2018-04-08 NOTE — Progress Notes (Signed)
   Subjective:  Patient states she is feeling some gradual improvement. She is still having a productive cough and sinus pressure. She was able to walk to the restroom a couple of times yesterday with only minimal shortness of breath.   Objective:  Vital signs in last 24 hours: Vitals:   04/07/18 2208 04/08/18 0315 04/08/18 0719 04/08/18 1144  BP: (!) 142/89  (!) 160/102   Pulse: 86  96   Resp: 20  19   Temp: 97.8 F (36.6 C)  98.2 F (36.8 C)   TempSrc: Oral  Oral   SpO2: 96% 96% (!) 89% 95%  Weight:      Height:       Constitutional: NAD CV: RRR Resp: No increased work of breathing; inspiratory and expiratory wheezing throughout anterior and posterior lung fields.   Assessment/Plan:  Principal Problem:   COPD exacerbation (HCC) Active Problems:   Long term current use of systemic steroids  COPD exacerbation 2/2 sinusitis Acute on chronic hypoxic respiratory failure: Patient with some subjective improvement; her lung exam is slightly worse this morning compared to yesterday. Will re-evaluate her this afternoon.  --continue Levaquin --continue prednisone 40mg  daily x5d total --continue duonebs q6hr --continue Breo, nasal sprays --wean to RA as able; ambulate with pulse ox to determine oxygen need; goal O2 sat is 90-94%  Anxiety:  Continue home ativan 0.5 mg BID PRN  Insomnia:  Continue home Ambien 5mg  qhs   Neuropathy:  -continue home Lyrica 300 TID    Dispo: Anticipated discharge in approximately 1 day(s).   Tracey Randolph, Khalifa Knecht, MD 04/08/2018, 12:00 PM Pager [phone]

## 2018-04-08 NOTE — Plan of Care (Signed)
Pt's ability to ambulate safely is improving however she has been educated on calling for assistance and visualization when in restroom and she refused.  Closed door on this RN when in restroom.  Reinforced education regarding fall prevention and safety.  No confusion or lack of understanding noted.  Pt fully alert and oriented.  Will continue to educate and encourage safety protocols.

## 2018-04-08 NOTE — Progress Notes (Signed)
Cardiac monitoring discontinued per MD order.  Pt verbalizes understanding and denies concerns.

## 2018-04-09 MED ORDER — IPRATROPIUM-ALBUTEROL 0.5-2.5 (3) MG/3ML IN SOLN
3.0000 mL | Freq: Four times a day (QID) | RESPIRATORY_TRACT | Status: DC
  Administered 2018-04-10 (×2): 3 mL via RESPIRATORY_TRACT
  Filled 2018-04-09 (×3): qty 3

## 2018-04-09 NOTE — Discharge Summary (Addendum)
Name: Tracey Randolph MRN: [id number] DOB: Aug 18, 1966 52 y.o. PCP: Eloisa Northern, MD  Date of Admission: 04/06/2018  4:18 AM Date of Discharge: 04/10/2018 Attending Physician: Dr. Blanch Media  Discharge Diagnosis: 1. COPD exacerbation 2. Chronic sinusitis  Discharge Medications: Allergies as of 04/10/2018      Reactions   Dulera [mometasone Furo-formoterol Fum] Hives   Morphine Sulfate    The patch only - causes rash   Cefuroxime Rash   Morphine Rash   "Patch form only"      Medication List    STOP taking these medications   fluconazole 100 MG tablet Commonly known as:  DIFLUCAN     TAKE these medications   ALPRAZolam 0.5 MG tablet Commonly known as:  XANAX Take 0.5 mg by mouth 2 (two) times daily as needed for anxiety.   budesonide 0.25 MG/2ML nebulizer solution Commonly known as:  PULMICORT Take 0.25 mg by nebulization 2 (two) times daily as needed (SOB).   cetirizine 10 MG tablet Commonly known as:  ZYRTEC Take 10 mg by mouth daily.   COPPER PO Take 1 tablet by mouth daily.   dextromethorphan-guaiFENesin 30-600 MG 12hr tablet Commonly known as:  MUCINEX DM Take 1 tablet by mouth 2 (two) times daily.   ferrous sulfate 325 (65 FE) MG tablet Take 325 mg by mouth daily with breakfast.   fluticasone 50 MCG/ACT nasal spray Commonly known as:  FLONASE Place 2 sprays into both nostrils 2 (two) times daily.   ipratropium-albuterol 0.5-2.5 (3) MG/3ML Soln Commonly known as:  DUONEB Take 3 mLs by nebulization 4 (four) times daily.   levofloxacin 500 MG tablet Commonly known as:  LEVAQUIN Take 1 tablet (500 mg total) by mouth daily.   MAGNESIUM PO Take 1 tablet by mouth daily.   nicotine 14 mg/24hr patch Commonly known as:  NICODERM CQ - dosed in mg/24 hours Place 1 patch (14 mg total) onto the skin daily.   predniSONE 20 MG tablet Commonly known as:  DELTASONE Take 1 tablet (20 mg total) by mouth daily with breakfast. What changed:    how much to  take  additional instructions   predniSONE 10 MG tablet Commonly known as:  DELTASONE Take 1 tablet (10 mg total) by mouth daily with breakfast. Take in addition to your 20mg  tab for 3 days. What changed:  You were already taking a medication with the same name, and this prescription was added. Make sure you understand how and when to take each.   pregabalin 100 MG capsule Commonly known as:  LYRICA Take 300 mg by mouth 3 (three) times daily.   albuterol (2.5 MG/3ML) 0.083% nebulizer solution Commonly known as:  PROVENTIL Take 2.5 mg by nebulization every 6 (six) hours as needed for wheezing or shortness of breath.   PROAIR HFA 108 (90 Base) MCG/ACT inhaler Generic drug:  albuterol INHALE 2 PUFFS INTO THE LUNGS EVERY 4 (FOUR) HOURS AS NEEDED FOR WHEEZING OR SHORTNESS OF BREATH.   SENEXON-S 8.6-50 MG tablet Generic drug:  senna-docusate TAKE 2 TABLETS BY MOUTH EVERY DAY AS NEEDED FOR CONSTIPATION   sodium chloride 0.65 % Soln nasal spray Commonly known as:  OCEAN Place 1 spray into both nostrils every 4 (four) hours.   VITAMIN A OP Apply 1 tablet to eye daily.   VITAMIN B-12 PO Take 1 tablet by mouth daily.   VITAMIN B-6 PO Take 1 tablet by mouth daily.   VITAMIN C PO Take 1 tablet by mouth daily.  VITAMIN D PO Take 1 tablet by mouth daily.   VITAMIN E PO Take 1 tablet by mouth daily.   zolpidem 10 MG tablet Commonly known as:  AMBIEN Take 10 mg by mouth at bedtime as needed for sleep.       Disposition and follow-up:   Tracey Randolph was discharged from [facility] [place] condition.  At the hospital follow up visit please address:  1.   COPD exacerbation: --patient to complete 7 day course of levaquin  --at discharge was asked to take prednisone 30mg  daily x3d prior to tapering down to her chronic 20mg  daily (she had already completed 5d course of 40mg  daily while inpatient) --assess if she is able to be weaned off of 2L [place] O2 and  that her O2 equipment is working appropriately  Chronic sinusitis: --patient to follow up with her ENT  --added on flonase and saline nasal spray; asked that she use nasal irrigation at least daily during flares  2.  Labs / imaging needed at time of follow-up: n/a  3.  Pending labs/ test needing follow-up: n/a  Follow-up Appointments: Follow-up Information    [facility] - Dme Follow up.   Why:  [facility] Contact information: [address] [place] [address] [phone]        Eloisa Northern, MD. Schedule an appointment as soon as possible for a visit in 1 week(s).   Specialty:  Internal Medicine Contact information: [address] [place] [address] [phone]        [facility], [facility]-Home Follow up.   Specialty:  Home Health Services Why:  [facility] Contact information: [address] [place] [address] [phone]           Hospital Course by problem list:  COPD exacerbation Chronic sinusitis  Tracey Randolph is a 52 year old woman with a history of COPD (on 2L home O2 PRN, PFTs not available), chronic urticaria and an unspecified neuropathy who presented with sinus pressure, difficulty breathing and chest tightness. She presented one week after being hospitalized at an OSH, where she was treated with breathing treatments, prednisone, and course of azithromycin. During the week following discharge she developed increasing difficulty breathing and worsening symptoms of sinusitis which she thought was provoked by lack of air conditioning in her family's car. Prior to these episodes, her last COPD exacerbation requiring hospitalization was April 2018. However, over this time recurrent symptoms of sinusitis have been associated with increased difficulty breathing. On admission she was afebrile, had no pulmonary infiltrates on CXR, had mild leukocytosis (had just completed increased steroid course from her chronic prednisone); attempted  ambulation in the ED on 2L showed desaturation to 88% and increased work of breathing so she was admitted for management. She was started on levaquin (had just completed azithromycin), prednisone 40mg  daily x5 days, breo, scheduled duonebs, and supplemental O2 at 2L. Over course of her hospitalization she her respiratory status slowly improved and she was discharged to complete 7d course of levaquin, taper prednisone to 30mg  x3 days prior to going back to 20mg  daily chronically, 2L continuous oxygen for now. She was encouraged to stop smoking and provided with nicotine patch. She is to follow up with her pulmonologist or PCP within a week.   Chronic sinusitis: Patient with flare of sinusitis which she believes triggered her recurrent COPD exacerbation. She was continued on her antihistamine; intranasal steroid, intranasal saline spray and decongestant were added to  her regimen. On discharge she was asked to use regular intranasal irrigation, and stop smoking to help further manage her symptoms. She was asked to follow up with her ENT as she states since her sinus surgery last year she has had worsening in her symptoms.  Anxiety, Insomnia and Neuropathy: The patient was maintained on her dose of ativan for anxiety, Ambien for insomnia and lyrica for an unspecifed neuropathy. These did not present significant challenges for the inpatient team.  Discharge Vitals:   BP 122/77 (BP Location: Right Arm)   Pulse 82   Temp 98.1 F (36.7 C) (Oral)   Resp 18   Ht 5\' 3"  (1.6 m)   Wt 126 lb 8.7 oz (57.4 kg)   SpO2 94%   BMI 22.42 kg/m   Pertinent Labs, Studies, and Procedures:  CBC    Component Value Date/Time   WBC 13.9 (H) 04/07/2018 0239   RBC 4.73 04/07/2018 0239   HGB 14.2 04/07/2018 0239   HCT 45.4 04/07/2018 0239   PLT 293 04/07/2018 0239   MCV 96.0 04/07/2018 0239   MCH 30.0 04/07/2018 0239   MCHC 31.3 04/07/2018 0239   RDW 12.8 04/07/2018 0239   RDW 13.4 11/05/2014 1155   LYMPHSABS 2.8  04/06/2018 0514   MONOABS 1.4 (H) 04/06/2018 0514   EOSABS 0.1 04/06/2018 0514   BASOSABS 0.0 04/06/2018 0514   BMET    Component Value Date/Time   NA 144 04/07/2018 0239   NA 142 11/05/2014 1155   K 4.2 04/07/2018 0239   CL 103 04/07/2018 0239   CO2 32 04/07/2018 0239   GLUCOSE 110 (H) 04/07/2018 0239   BUN 24 (H) 04/07/2018 0239   BUN 7 11/05/2014 1155   CREATININE 0.64 04/07/2018 0239   CALCIUM 8.8 (L) 04/07/2018 0239   GFRNONAA >60 04/07/2018 0239   GFRAA >60 04/07/2018 0239   HIV ab screen 04/06/2018: Non reactive  CXR 04/06/2018: Normal heart size and pulmonary vascularity. No focal airspace disease or consolidation in the lungs. No blunting of costophrenic angles. No pneumothorax. Mediastinal contours appear intact.  Discharge Instructions: Discharge Instructions    Call MD for:  difficulty breathing, headache or visual disturbances   Complete by:  As directed    Call MD for:  extreme fatigue   Complete by:  As directed    Call MD for:  hives   Complete by:  As directed    Call MD for:  persistant dizziness or light-headedness   Complete by:  As directed    Call MD for:  persistant nausea and vomiting   Complete by:  As directed    Call MD for:  redness, tenderness, or signs of infection (pain, swelling, redness, odor or green/yellow discharge around incision site)   Complete by:  As directed    Call MD for:  severe uncontrolled pain   Complete by:  As directed    Call MD for:  temperature >100.4   Complete by:  As directed    Diet - low sodium heart healthy   Complete by:  As directed    Discharge instructions   Complete by:  As directed    For your COPD exacerbation: --take levaquin one pill once a day for two days starting tomorrow --take an extra 10mg  prednisone on top of your daily 20mg  for 3 days then just continue your 20mg  daily from then on --continue your home inhalers and nebulizers; I have prescribed duoneb nebulizer solution as well that you can  use  every 6 hours while your breathing recovers. --you can use the Mucinex DM for your cough to help break up the congestion --I would encourage you to stop smoking as this will continue to have a negative impact on your breathing  For your sinuses: I have prescribed the flonase twice a day, saline nasal spray as needed; continue your zyrtec, zolair and follow up with your Ear, Nose and Throat doctor.  Please follow up with your primary care provider or pulmonologist in 1 week.   Increase activity slowly   Complete by:  As directed       Signed: Nyra MarketSvalina, Daniell Mancinas, MD 04/12/2018, 9:37 AM

## 2018-04-09 NOTE — Progress Notes (Signed)
Patient ambulated down the hall around the circle, and back without Oxygen this evening and maintained Oxygen saturations from 90-92% on Room air.  She was 90-91 upon returning to her room.

## 2018-04-09 NOTE — Progress Notes (Signed)
SATURATION QUALIFICATIONS: (This note is used to comply with regulatory documentation for home oxygen)  Patient Saturations on Room Air at Rest = 93%  Patient Saturations on Room Air while Ambulating =Patient refused  Patient Saturations on 2 Liters of oxygen while Ambulating = 88-93%  Please briefly explain why patient needs home oxygen: Patient SOB while walking and any increased activity

## 2018-04-09 NOTE — Progress Notes (Signed)
  Date: 04/09/2018  Patient name: Tracey Randolph  Medical record number: [id number]  Date of birth: 06/25/1966   I have seen and evaluated this patient and I have discussed the plan of care with the house staff. Please see their note for complete details. I concur with their findings with the following additions/corrections: [name] was seen on AM rounds with team. She had not yet had the opportunity to walk in hallways but subsequently ambulated with 2 L with O2 sats 88% +. Lungs cont to have wheezing and tightness with adequate air flow. Sxs improved but not yet at baseline. Will reassess in PM regarding dispo.   Burns SpainButcher, Tanelle Lanzo A, MD 04/09/2018, 2:18 PM

## 2018-04-09 NOTE — Care Management Note (Signed)
Case Management Note  Patient Details  Name: Tracey Randolph MRN: [id number] Date of Birth: 01/27/1966  Subjective/Objective:    From home with spouse, presents with copd ex, she is for possible dc home today or tomorrow.  She has home oxygen with aero flow and she states her husband informed her that they came out to the house to make sure her oxygen was operating correctly.  Her husband will bring a portable tank for her to go home with at discharge.  [facility], he will bring to room prior to discharge.               Action/Plan: DC home when medically ready.   Expected Discharge Date:                  Expected Discharge Plan:  Home/Self Care  In-House Referral:     Discharge planning Services  CM Consult  Post Acute Care Choice:  Durable Medical Equipment Choice offered to:  Patient  DME Arranged:  Bedside commode DME Agency:  [facility]  HH Arranged:    HH Agency:     Status of Service:  Completed, signed off  If discussed at Long Length of Stay Meetings, dates discussed:    Additional Comments:  Tracey Randolph, Tracey Delisle Clinton, RN 04/09/2018, 9:23 AM

## 2018-04-09 NOTE — Progress Notes (Addendum)
Subjective:  Tracey Randolph states that she is feeling better this morning. She is getting closer to her baseline status and is now able to go to the bathroom without getting short of breath. She still complains of congestion in her sinuses, a productive cough and nurnbness and tingling in her legs consistent with her chronic neuropathy.   Objective:  Vital signs in last 24 hours: Vitals:   04/08/18 2320 04/09/18 0105 04/09/18 0745 04/09/18 0823  BP: 135/80   (!) 152/76  Pulse: 77 74  98  Resp: 16 16  20   Temp: 98 F (36.7 C)   98.2 F (36.8 C)  TempSrc: Oral   Oral  SpO2: 96%  94% 97%  Weight:      Height:       Weight change:   Intake/Output Summary (Last 24 hours) at 04/09/2018 1151 Last data filed at 04/08/2018 1410 Gross per 24 hour  Intake 240 ml  Output -  Net 240 ml   Physical Exam: General: chronically ill appearing woman in no acute distress  Pulm: diffuse expiratory wheezes, poor air movement lower> upper lung fields, fine crackles present throughout lower> upper lung fields, patient coughs during deep inhalation on exam CV: RRR, nMRG, no peripheral edema  Assessment/Plan:  Principal Problem:   COPD exacerbation (HCC) Active Problems:   Long term current use of systemic steroids   Acute bacterial sinusitis  Tracey Randolph is a 52 year old woman with a history of COPD (on home O2 2L prn) chronic urticaria and anxiety. She presented a week after being discharged from an OSH with SOB, presumably due to an exacerbation of her underlying COPD and sinus pressure/ congestion.  COPD Exacerbation Secondary to Sinusitis Acute on chronic hypoxic respiratory failure: Tracey Randolph presented with dyspnea, cough, and hypoxia secondary to an exacerbation of previously diagnosed COPD, on 2L O2 PRN prescribed by her pulmonologist. She presents with a productive cough and to a flare of a sinus infection, which she has struggled with for the last year. These symptoms could represent a  bacterial or viral etiology that has precipitated these consecutive exacerbations of her otherwise relatively well-controlled COPD (last hospitalization April 2018). Chest xray did not suggest pneumonia as etiology and diffuse wheezes vs crackles on lung exam suggest COPD vs fluid overload. VBG on admission does not reveal significant acid/base disturbance- pH 7.38, CO2 56, HCO3 33.1- likely chronic considering pH in normal range -Duoneb q6  -Levoflaxacin- tx COPD exacerbation and potential bacterial sinusitis (pt with allergy to cephalosporins); had been on azithromycin within the last week  -prednisone 40 mg qd  -O2 Sat goal 90-94%- supplemental O2 as needed; wean as able -Flonase, Mucinex and nasal saline spray as decongestants  -Ambulate patient in hallway to ensure O2>88%  -will reassess in the afternoon; may need to stay another day or so for pulmonary recovery  Anxiety:  Continue home ativan 0.5 mg BID PRN  Insomnia:  Continue home Ambien 5mg  qhs   Neuropathy:  -continue home Lyrica 300 TID    FEN/GI: thin fluid diet, monitor electrolytes and replete as needed  Steroid ppx: Protonix 40 mg  Ppx: Lovenox, Protonix 40 mg    LOS: 3 days   Lindie Spruce, Medical Student 04/09/2018, 11:51 AM   Attestation for Student Documentation:  I personally was present and performed or re-performed the history, physical exam and medical decision-making activities of this service and have verified that the service and findings are accurately documented in the student's note.  Nyra MarketSvalina, Masin Shatto, MD 04/09/2018, 12:59 PM

## 2018-04-10 DIAGNOSIS — Z720 Tobacco use: Secondary | ICD-10-CM

## 2018-04-10 MED ORDER — IPRATROPIUM-ALBUTEROL 0.5-2.5 (3) MG/3ML IN SOLN: 3.0000 mL | Freq: Four times a day (QID) | RESPIRATORY_TRACT | 2 refills | Status: AC

## 2018-04-10 MED ORDER — DM-GUAIFENESIN ER 30-600 MG PO TB12
1.0000 | ORAL_TABLET | Freq: Two times a day (BID) | ORAL | 0 refills | Status: AC
Start: 2018-04-10 — End: ?

## 2018-04-10 MED ORDER — SALINE SPRAY 0.65 % NA SOLN: 1.0000 | NASAL | 0 refills | Status: AC

## 2018-04-10 MED ORDER — PREDNISONE 20 MG PO TABS: 20.0000 mg | ORAL_TABLET | Freq: Every day | ORAL | Status: AC

## 2018-04-10 MED ORDER — PREDNISONE 10 MG PO TABS: 10.0000 mg | ORAL_TABLET | Freq: Every day | ORAL | 0 refills | Status: AC

## 2018-04-10 MED ORDER — FLUTICASONE PROPIONATE 50 MCG/ACT NA SUSP: 2.0000 | Freq: Two times a day (BID) | NASAL | 2 refills | Status: AC

## 2018-04-10 MED ORDER — NICOTINE 14 MG/24HR TD PT24: 14.0000 mg | MEDICATED_PATCH | TRANSDERMAL | 0 refills | Status: AC

## 2018-04-10 MED ORDER — LEVOFLOXACIN 500 MG PO TABS: 500.0000 mg | ORAL_TABLET | Freq: Every day | ORAL | 0 refills | Status: AC

## 2018-04-10 NOTE — Progress Notes (Signed)
  Date: 04/10/2018  Patient name: Tracey Randolph  Medical record number: [id number]  Date of birth: 05/19/1966   I have seen and evaluated this patient and I have discussed the plan of care with the house staff. Please see their note for complete details. I concur with their findings with the following additions/corrections: [name] was seen on AM rounds with team. She woke up and had a neb tx about 30 min prior. Lungs exam shows improved air flow and diminished, but still present, diffuse wheezing. She is not desat on RA while ambulating. She feels ready to go home but requests a short term nurse at home. We will arrange.  Burns SpainButcher, Elizabeth A, MD 04/10/2018, 1:03 PM

## 2018-04-10 NOTE — Care Management Note (Signed)
Case Management Note  Patient Details  Name: Tracey Randolph MRN: [id number] Date of Birth: 05/16/1966  Subjective/Objective:    From home with spouse, presents with copd ex, she is for possible dc home today or tomorrow.  She has home oxygen with aero flow and she states her husband informed her that they came out to the house to make sure her oxygen was operating correctly.  Her husband will bring a portable tank for her to go home with at discharge.  [facility], he will bring to room prior to discharge. [facility].  [facility] . Soc will begin 24-48 hrs post dc.                  [facility]Plan[facility]  Expected Discharge Date:  04/10/18               Expected Discharge Plan:  Home w Home Health Services  In-House Referral:     Discharge planning Services  CM Consult  Post Acute Care Choice:  Durable Medical Equipment Choice offered to:  Patient  DME Arranged:  Bedside commode DME Agency:  [facility]  HH Arranged:  [facility]  [facility]  Status of Service:  Completed, signed off  If discussed at Long Length of Stay Meetings, dates discussed:    Additional Comments:  Leone Havenaylor, Zaydan Papesh Clinton, RN 04/10/2018, 3:25 PM

## 2018-04-10 NOTE — Consult Note (Signed)
[facility] CM Primary Care Navigator  04/10/2018  Tracey Randolph 1966-08-01 [id number]   Met withpatient at the bedside toidentify possible discharge needs. Patient reportsthatshe was "having difficulty breathing and weakness" thatresulted to thisadmission.(COPD exacerbation secondary to sinusitis)  PatientendorsesDr. Garwin Brothers with [facility] as her primary care provider.   [address] [place] toobtain medications without difficulty.  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  Patient states that her husband Christia Reading) provides transportation to her doctors'appointments.  Patientverbalized that husband is her primary caregiverat home.  Anticipated plan for discharge ishome according to patient.  Patient voiced understandingto callprimarycareprovider'soffice whenshereturnshome,for a post discharge follow-upvisitwithin1- 2 weeksor sooner if needs arise.Patient letter (with PCP's contact number) was provided asareminder.   Explained topatientregarding [facility] CM services available for health management/ resourcesat homeand hadvoiced interest for it. Patient states that she has been managing her health issues at home, so far. COPD action plan discussed with her. Patientexpressedunderstandingto seekreferral from primary care provider to [facility] care management ifdeemed necessary and appropriatefor anyservicesin the nearfuture.   [facility] care management information was provided for futureneeds thatshemay have.  Patient had only opted and verbally agreed to [facility] COPD calls to follow-up with her recovery at home.  Referral made for EMMI COPD calls after discharge.    For additional questions please contact:  Edwena Felty A. Mollyann Halbert, BSN, RN-BC [facility] PRIMARY CARE Navigator Cell: [phone]

## 2018-04-10 NOTE — Progress Notes (Addendum)
   Subjective:  [name] is in good health this morning she claims that her SOB has gradually improved over the course of her hospitalization. She was able to ambulate in the hallway yesterday. She is interested on what home services are available to help her transition to home when she is discharged.  Objective:  Vital signs in last 24 hours: Vitals:   04/09/18 2300 04/10/18 0727 04/10/18 0850 04/10/18 1157  BP: 134/79 (!) 145/81    Pulse: 90 75    Resp: 16 17    Temp: 98.1 F (36.7 C) 97.8 F (36.6 C)    TempSrc: Oral Oral    SpO2: 95% 98% 98% 94%  Weight:      Height:       Weight change:  No intake or output data in the 24 hours ending 04/10/18 1318   Physical Exam: General: chronically ill appearing woman in no acute distress  Pulm: no increased work of breathing, much improved air movement throughout lung fields though still mild diffuse wheezing and crackles present CV: RRR, nMRG, no peripheral edema Abd: BS+ NTND   Assessment/Plan:  Principal Problem:   COPD exacerbation (HCC) Active Problems:   Long term current use of systemic steroids   Acute bacterial sinusitis  [name] is a 52 year old woman with a history of COPD (on home O2 2L prn) chronic urticaria and anxiety. She presented a week after being discharged from an OSH with SOB, presumably due to an exacerbation of her underlying COPD and sinus pressure/ congestion.  COPD Exacerbation Secondary to Sinusitis Acute on chronic hypoxic respiratory failure: [name] presented with dyspnea, cough, and hypoxia secondary to an exacerbation of previously diagnosed COPD, on 2L O2 PRN prescribed by her pulmonologist. She presented with a productive cough and to a flare of a sinus infection, which she has struggled with for the last year. These symptoms could represent a bacterial or viral etiology that has precipitated these consecutive exacerbations of her otherwise relatively well-controlled COPD (last  hospitalization April 2018). Patient is subjectively less short of breath and lung exam has improved from admission.  -Duoneb q6  -Levoflaxacin---> 07/12, tx COPD exacerbation and potential bacterial sinusitis (pt with allergy to cephalosporins); had been on azithromycin within the last week  -prednisone 30 mg for 3 days then 20 mg until outpatient follow up  -O2 Sat goal 90-94%- supplemental O2 as needed; wean as able; has O2 supplies at home and CM will provide portable O2 for transition to home -Flonase, Mucinex and nasal saline spray as decongestants prn  -HH RN ordered for decline in patient's status  Anxiety:  Continue home ativan 0.5 mg BID PRN  Insomnia:  Continue home Ambien 5mg  qhs   Neuropathy:  -continue home Lyrica 300 TID    Steroid ppx: Protonix 40 mg  Ppx: Lovenox, Protonix 40 mg   Discharge today.   LOS: 4 days   Lindie Spruceuningham, John, Medical Student 04/10/2018, 1:18 PM   Attestation for Student Documentation:  I personally was present and performed or re-performed the history, physical exam and medical decision-making activities of this service and have verified that the service and findings are accurately documented in the student's note.  Nyra MarketSvalina, Vlada Uriostegui, MD 04/10/2018, 1:53 PM

## 2018-04-16 DIAGNOSIS — F419 Anxiety disorder, unspecified: Secondary | ICD-10-CM | POA: Diagnosis not present

## 2018-04-16 DIAGNOSIS — G4700 Insomnia, unspecified: Secondary | ICD-10-CM | POA: Diagnosis not present

## 2018-04-16 DIAGNOSIS — G609 Hereditary and idiopathic neuropathy, unspecified: Secondary | ICD-10-CM | POA: Diagnosis not present

## 2018-04-16 DIAGNOSIS — J441 Chronic obstructive pulmonary disease with (acute) exacerbation: Secondary | ICD-10-CM | POA: Diagnosis not present

## 2018-04-16 DIAGNOSIS — J329 Chronic sinusitis, unspecified: Secondary | ICD-10-CM | POA: Diagnosis not present

## 2018-04-17 DIAGNOSIS — R0902 Hypoxemia: Secondary | ICD-10-CM | POA: Diagnosis not present

## 2018-04-17 DIAGNOSIS — G4733 Obstructive sleep apnea (adult) (pediatric): Secondary | ICD-10-CM | POA: Diagnosis not present

## 2018-04-17 DIAGNOSIS — J449 Chronic obstructive pulmonary disease, unspecified: Secondary | ICD-10-CM | POA: Diagnosis not present

## 2018-04-19 DIAGNOSIS — J4520 Mild intermittent asthma, uncomplicated: Secondary | ICD-10-CM | POA: Diagnosis not present

## 2018-04-19 DIAGNOSIS — G4733 Obstructive sleep apnea (adult) (pediatric): Secondary | ICD-10-CM | POA: Diagnosis not present

## 2018-04-19 DIAGNOSIS — J449 Chronic obstructive pulmonary disease, unspecified: Secondary | ICD-10-CM | POA: Diagnosis not present

## 2018-04-23 DIAGNOSIS — J4541 Moderate persistent asthma with (acute) exacerbation: Secondary | ICD-10-CM | POA: Diagnosis not present

## 2018-04-23 DIAGNOSIS — J9601 Acute respiratory failure with hypoxia: Secondary | ICD-10-CM | POA: Diagnosis not present

## 2018-04-23 DIAGNOSIS — Z720 Tobacco use: Secondary | ICD-10-CM | POA: Diagnosis not present
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
## 2018-04-30 DIAGNOSIS — Z9981 Dependence on supplemental oxygen: Secondary | ICD-10-CM | POA: Diagnosis not present

## 2018-04-30 DIAGNOSIS — B370 Candidal stomatitis: Secondary | ICD-10-CM | POA: Diagnosis not present

## 2018-04-30 DIAGNOSIS — Z87891 Personal history of nicotine dependence: Secondary | ICD-10-CM | POA: Diagnosis not present

## 2018-04-30 DIAGNOSIS — J3489 Other specified disorders of nose and nasal sinuses: Secondary | ICD-10-CM | POA: Diagnosis not present

## 2018-04-30 DIAGNOSIS — R0982 Postnasal drip: Secondary | ICD-10-CM | POA: Diagnosis not present

## 2018-04-30 DIAGNOSIS — J449 Chronic obstructive pulmonary disease, unspecified: Secondary | ICD-10-CM | POA: Diagnosis not present

## 2018-05-01 DIAGNOSIS — G4733 Obstructive sleep apnea (adult) (pediatric): Secondary | ICD-10-CM | POA: Diagnosis not present

## 2018-05-01 DIAGNOSIS — J301 Allergic rhinitis due to pollen: Secondary | ICD-10-CM | POA: Diagnosis not present

## 2018-05-01 DIAGNOSIS — R5383 Other fatigue: Secondary | ICD-10-CM | POA: Diagnosis not present

## 2018-05-01 DIAGNOSIS — J4550 Severe persistent asthma, uncomplicated: Secondary | ICD-10-CM | POA: Diagnosis not present

## 2018-05-01 DIAGNOSIS — F411 Generalized anxiety disorder: Secondary | ICD-10-CM | POA: Diagnosis not present

## 2018-05-03 DIAGNOSIS — J329 Chronic sinusitis, unspecified: Secondary | ICD-10-CM | POA: Diagnosis not present

## 2018-05-03 DIAGNOSIS — G609 Hereditary and idiopathic neuropathy, unspecified: Secondary | ICD-10-CM | POA: Diagnosis not present

## 2018-05-03 DIAGNOSIS — J441 Chronic obstructive pulmonary disease with (acute) exacerbation: Secondary | ICD-10-CM | POA: Diagnosis not present

## 2018-05-05 DIAGNOSIS — G4733 Obstructive sleep apnea (adult) (pediatric): Secondary | ICD-10-CM | POA: Diagnosis not present

## 2018-05-05 DIAGNOSIS — J449 Chronic obstructive pulmonary disease, unspecified: Secondary | ICD-10-CM | POA: Diagnosis not present

## 2018-05-05 DIAGNOSIS — J4520 Mild intermittent asthma, uncomplicated: Secondary | ICD-10-CM | POA: Diagnosis not present

## 2018-05-10 DIAGNOSIS — J329 Chronic sinusitis, unspecified: Secondary | ICD-10-CM | POA: Diagnosis not present

## 2018-05-10 DIAGNOSIS — J441 Chronic obstructive pulmonary disease with (acute) exacerbation: Secondary | ICD-10-CM | POA: Diagnosis not present

## 2018-05-10 DIAGNOSIS — G609 Hereditary and idiopathic neuropathy, unspecified: Secondary | ICD-10-CM | POA: Diagnosis not present

## 2018-05-15 DIAGNOSIS — J329 Chronic sinusitis, unspecified: Secondary | ICD-10-CM | POA: Diagnosis not present

## 2018-05-15 DIAGNOSIS — G609 Hereditary and idiopathic neuropathy, unspecified: Secondary | ICD-10-CM | POA: Diagnosis not present

## 2018-05-15 DIAGNOSIS — J441 Chronic obstructive pulmonary disease with (acute) exacerbation: Secondary | ICD-10-CM | POA: Diagnosis not present

## 2018-05-17 ENCOUNTER — Other Ambulatory Visit: Payer: Self-pay | Admitting: Internal Medicine

## 2018-05-17 NOTE — Telephone Encounter (Signed)
This patient is not a patient of our clinic and was only on a short course of additional steroids for her COPD. Will not refill at this time. Her PCP should fill her chronic prednisone

## 2018-05-17 NOTE — Telephone Encounter (Signed)
She needs to contact her PCP

## 2018-05-18 DIAGNOSIS — J449 Chronic obstructive pulmonary disease, unspecified: Secondary | ICD-10-CM | POA: Diagnosis not present

## 2018-05-20 DIAGNOSIS — G4733 Obstructive sleep apnea (adult) (pediatric): Secondary | ICD-10-CM | POA: Diagnosis not present

## 2018-05-20 DIAGNOSIS — J4520 Mild intermittent asthma, uncomplicated: Secondary | ICD-10-CM | POA: Diagnosis not present

## 2018-05-20 DIAGNOSIS — J449 Chronic obstructive pulmonary disease, unspecified: Secondary | ICD-10-CM | POA: Diagnosis not present

## 2018-05-23 DIAGNOSIS — J329 Chronic sinusitis, unspecified: Secondary | ICD-10-CM | POA: Diagnosis not present

## 2018-05-23 DIAGNOSIS — J441 Chronic obstructive pulmonary disease with (acute) exacerbation: Secondary | ICD-10-CM | POA: Diagnosis not present

## 2018-05-23 DIAGNOSIS — G609 Hereditary and idiopathic neuropathy, unspecified: Secondary | ICD-10-CM | POA: Diagnosis not present

## 2018-05-29 DIAGNOSIS — R5382 Chronic fatigue, unspecified: Secondary | ICD-10-CM | POA: Diagnosis not present

## 2018-05-29 DIAGNOSIS — B370 Candidal stomatitis: Secondary | ICD-10-CM | POA: Diagnosis not present

## 2018-05-30 DIAGNOSIS — J4550 Severe persistent asthma, uncomplicated: Secondary | ICD-10-CM | POA: Diagnosis not present

## 2018-05-30 DIAGNOSIS — F17210 Nicotine dependence, cigarettes, uncomplicated: Secondary | ICD-10-CM | POA: Diagnosis not present

## 2018-05-30 DIAGNOSIS — G4733 Obstructive sleep apnea (adult) (pediatric): Secondary | ICD-10-CM | POA: Diagnosis not present

## 2018-05-30 DIAGNOSIS — J441 Chronic obstructive pulmonary disease with (acute) exacerbation: Secondary | ICD-10-CM | POA: Diagnosis not present

## 2018-05-30 DIAGNOSIS — F411 Generalized anxiety disorder: Secondary | ICD-10-CM | POA: Diagnosis not present

## 2018-05-30 DIAGNOSIS — G609 Hereditary and idiopathic neuropathy, unspecified: Secondary | ICD-10-CM | POA: Diagnosis not present

## 2018-05-30 DIAGNOSIS — J329 Chronic sinusitis, unspecified: Secondary | ICD-10-CM | POA: Diagnosis not present

## 2018-05-30 DIAGNOSIS — J301 Allergic rhinitis due to pollen: Secondary | ICD-10-CM | POA: Diagnosis not present

## 2018-05-30 DIAGNOSIS — R5383 Other fatigue: Secondary | ICD-10-CM | POA: Diagnosis not present

## 2018-06-05 DIAGNOSIS — G4733 Obstructive sleep apnea (adult) (pediatric): Secondary | ICD-10-CM | POA: Diagnosis not present

## 2018-06-05 DIAGNOSIS — J329 Chronic sinusitis, unspecified: Secondary | ICD-10-CM | POA: Diagnosis not present

## 2018-06-05 DIAGNOSIS — J449 Chronic obstructive pulmonary disease, unspecified: Secondary | ICD-10-CM | POA: Diagnosis not present

## 2018-06-05 DIAGNOSIS — J441 Chronic obstructive pulmonary disease with (acute) exacerbation: Secondary | ICD-10-CM | POA: Diagnosis not present

## 2018-06-05 DIAGNOSIS — G609 Hereditary and idiopathic neuropathy, unspecified: Secondary | ICD-10-CM | POA: Diagnosis not present

## 2018-06-05 DIAGNOSIS — J4520 Mild intermittent asthma, uncomplicated: Secondary | ICD-10-CM | POA: Diagnosis not present

## 2018-06-14 DIAGNOSIS — J329 Chronic sinusitis, unspecified: Secondary | ICD-10-CM | POA: Diagnosis not present

## 2018-06-14 DIAGNOSIS — J441 Chronic obstructive pulmonary disease with (acute) exacerbation: Secondary | ICD-10-CM | POA: Diagnosis not present

## 2018-06-14 DIAGNOSIS — G609 Hereditary and idiopathic neuropathy, unspecified: Secondary | ICD-10-CM | POA: Diagnosis not present

## 2018-06-18 DIAGNOSIS — J449 Chronic obstructive pulmonary disease, unspecified: Secondary | ICD-10-CM | POA: Diagnosis not present

## 2018-06-20 DIAGNOSIS — J4520 Mild intermittent asthma, uncomplicated: Secondary | ICD-10-CM | POA: Diagnosis not present

## 2018-06-20 DIAGNOSIS — G4733 Obstructive sleep apnea (adult) (pediatric): Secondary | ICD-10-CM | POA: Diagnosis not present

## 2018-06-20 DIAGNOSIS — J449 Chronic obstructive pulmonary disease, unspecified: Secondary | ICD-10-CM | POA: Diagnosis not present

## 2018-06-20 DIAGNOSIS — J4541 Moderate persistent asthma with (acute) exacerbation: Secondary | ICD-10-CM | POA: Diagnosis not present

## 2018-06-20 DIAGNOSIS — B370 Candidal stomatitis: Secondary | ICD-10-CM | POA: Diagnosis not present

## 2018-07-05 DIAGNOSIS — G5600 Carpal tunnel syndrome, unspecified upper limb: Secondary | ICD-10-CM | POA: Diagnosis not present

## 2018-07-05 DIAGNOSIS — J449 Chronic obstructive pulmonary disease, unspecified: Secondary | ICD-10-CM | POA: Diagnosis not present

## 2018-07-05 DIAGNOSIS — J45909 Unspecified asthma, uncomplicated: Secondary | ICD-10-CM | POA: Diagnosis not present

## 2018-07-05 DIAGNOSIS — J4520 Mild intermittent asthma, uncomplicated: Secondary | ICD-10-CM | POA: Diagnosis not present

## 2018-07-05 DIAGNOSIS — Z0000 Encounter for general adult medical examination without abnormal findings: Secondary | ICD-10-CM | POA: Diagnosis not present

## 2018-07-05 DIAGNOSIS — D509 Iron deficiency anemia, unspecified: Secondary | ICD-10-CM | POA: Diagnosis not present

## 2018-07-05 DIAGNOSIS — G4733 Obstructive sleep apnea (adult) (pediatric): Secondary | ICD-10-CM | POA: Diagnosis not present

## 2018-07-18 DIAGNOSIS — J449 Chronic obstructive pulmonary disease, unspecified: Secondary | ICD-10-CM | POA: Diagnosis not present

## 2018-07-20 DIAGNOSIS — G4733 Obstructive sleep apnea (adult) (pediatric): Secondary | ICD-10-CM | POA: Diagnosis not present

## 2018-07-20 DIAGNOSIS — J4520 Mild intermittent asthma, uncomplicated: Secondary | ICD-10-CM | POA: Diagnosis not present

## 2018-07-20 DIAGNOSIS — J449 Chronic obstructive pulmonary disease, unspecified: Secondary | ICD-10-CM | POA: Diagnosis not present

## 2018-07-26 DIAGNOSIS — Z9889 Other specified postprocedural states: Secondary | ICD-10-CM | POA: Diagnosis not present

## 2018-07-26 DIAGNOSIS — J449 Chronic obstructive pulmonary disease, unspecified: Secondary | ICD-10-CM | POA: Diagnosis not present

## 2018-07-26 DIAGNOSIS — J329 Chronic sinusitis, unspecified: Secondary | ICD-10-CM | POA: Diagnosis not present

## 2018-07-26 DIAGNOSIS — R0981 Nasal congestion: Secondary | ICD-10-CM | POA: Diagnosis not present

## 2018-08-05 DIAGNOSIS — J449 Chronic obstructive pulmonary disease, unspecified: Secondary | ICD-10-CM | POA: Diagnosis not present

## 2018-08-05 DIAGNOSIS — J4520 Mild intermittent asthma, uncomplicated: Secondary | ICD-10-CM | POA: Diagnosis not present

## 2018-08-05 DIAGNOSIS — G4733 Obstructive sleep apnea (adult) (pediatric): Secondary | ICD-10-CM | POA: Diagnosis not present

## 2018-08-10 DIAGNOSIS — J329 Chronic sinusitis, unspecified: Secondary | ICD-10-CM | POA: Diagnosis not present

## 2018-08-15 DIAGNOSIS — T8543XA Leakage of breast prosthesis and implant, initial encounter: Secondary | ICD-10-CM | POA: Diagnosis not present

## 2018-08-15 DIAGNOSIS — T85898A Other specified complication of other internal prosthetic devices, implants and grafts, initial encounter: Secondary | ICD-10-CM | POA: Diagnosis not present

## 2018-08-15 DIAGNOSIS — N6311 Unspecified lump in the right breast, upper outer quadrant: Secondary | ICD-10-CM | POA: Diagnosis not present

## 2018-08-15 DIAGNOSIS — R928 Other abnormal and inconclusive findings on diagnostic imaging of breast: Secondary | ICD-10-CM | POA: Diagnosis not present

## 2018-08-16 DIAGNOSIS — Z9889 Other specified postprocedural states: Secondary | ICD-10-CM | POA: Diagnosis not present

## 2018-08-16 DIAGNOSIS — R0981 Nasal congestion: Secondary | ICD-10-CM | POA: Diagnosis not present

## 2018-08-16 DIAGNOSIS — J449 Chronic obstructive pulmonary disease, unspecified: Secondary | ICD-10-CM | POA: Diagnosis not present

## 2018-08-18 DIAGNOSIS — J449 Chronic obstructive pulmonary disease, unspecified: Secondary | ICD-10-CM | POA: Diagnosis not present

## 2018-08-20 DIAGNOSIS — J449 Chronic obstructive pulmonary disease, unspecified: Secondary | ICD-10-CM | POA: Diagnosis not present

## 2018-08-20 DIAGNOSIS — G4733 Obstructive sleep apnea (adult) (pediatric): Secondary | ICD-10-CM | POA: Diagnosis not present

## 2018-08-20 DIAGNOSIS — R5383 Other fatigue: Secondary | ICD-10-CM | POA: Diagnosis not present

## 2018-08-20 DIAGNOSIS — J4550 Severe persistent asthma, uncomplicated: Secondary | ICD-10-CM | POA: Diagnosis not present

## 2018-08-20 DIAGNOSIS — F411 Generalized anxiety disorder: Secondary | ICD-10-CM | POA: Diagnosis not present

## 2018-08-20 DIAGNOSIS — J4520 Mild intermittent asthma, uncomplicated: Secondary | ICD-10-CM | POA: Diagnosis not present

## 2018-08-20 DIAGNOSIS — J301 Allergic rhinitis due to pollen: Secondary | ICD-10-CM | POA: Diagnosis not present

## 2018-08-23 DIAGNOSIS — J019 Acute sinusitis, unspecified: Secondary | ICD-10-CM | POA: Diagnosis not present

## 2018-08-23 DIAGNOSIS — J3489 Other specified disorders of nose and nasal sinuses: Secondary | ICD-10-CM | POA: Diagnosis not present

## 2018-08-23 DIAGNOSIS — J329 Chronic sinusitis, unspecified: Secondary | ICD-10-CM | POA: Diagnosis not present

## 2018-09-02 DIAGNOSIS — J4520 Mild intermittent asthma, uncomplicated: Secondary | ICD-10-CM | POA: Diagnosis not present

## 2018-09-02 DIAGNOSIS — J449 Chronic obstructive pulmonary disease, unspecified: Secondary | ICD-10-CM | POA: Diagnosis not present

## 2018-09-02 DIAGNOSIS — G4733 Obstructive sleep apnea (adult) (pediatric): Secondary | ICD-10-CM | POA: Diagnosis not present

## 2018-09-03 DIAGNOSIS — G6289 Other specified polyneuropathies: Secondary | ICD-10-CM | POA: Diagnosis not present

## 2018-09-03 DIAGNOSIS — G629 Polyneuropathy, unspecified: Secondary | ICD-10-CM | POA: Diagnosis not present

## 2018-09-03 DIAGNOSIS — G608 Other hereditary and idiopathic neuropathies: Secondary | ICD-10-CM | POA: Diagnosis not present

## 2018-09-04 DIAGNOSIS — G4733 Obstructive sleep apnea (adult) (pediatric): Secondary | ICD-10-CM | POA: Diagnosis not present

## 2018-09-04 DIAGNOSIS — J449 Chronic obstructive pulmonary disease, unspecified: Secondary | ICD-10-CM | POA: Diagnosis not present

## 2018-09-04 DIAGNOSIS — J4520 Mild intermittent asthma, uncomplicated: Secondary | ICD-10-CM | POA: Diagnosis not present

## 2018-09-12 DIAGNOSIS — J4550 Severe persistent asthma, uncomplicated: Secondary | ICD-10-CM | POA: Diagnosis not present

## 2018-09-12 DIAGNOSIS — F411 Generalized anxiety disorder: Secondary | ICD-10-CM | POA: Diagnosis not present

## 2018-09-12 DIAGNOSIS — R5383 Other fatigue: Secondary | ICD-10-CM | POA: Diagnosis not present

## 2018-09-12 DIAGNOSIS — G4733 Obstructive sleep apnea (adult) (pediatric): Secondary | ICD-10-CM | POA: Diagnosis not present

## 2018-09-12 DIAGNOSIS — J301 Allergic rhinitis due to pollen: Secondary | ICD-10-CM | POA: Diagnosis not present

## 2018-09-17 DIAGNOSIS — J449 Chronic obstructive pulmonary disease, unspecified: Secondary | ICD-10-CM | POA: Diagnosis not present

## 2018-09-19 DIAGNOSIS — G4733 Obstructive sleep apnea (adult) (pediatric): Secondary | ICD-10-CM | POA: Diagnosis not present

## 2018-09-19 DIAGNOSIS — J4520 Mild intermittent asthma, uncomplicated: Secondary | ICD-10-CM | POA: Diagnosis not present

## 2018-09-19 DIAGNOSIS — J449 Chronic obstructive pulmonary disease, unspecified: Secondary | ICD-10-CM | POA: Diagnosis not present

## 2018-10-04 DIAGNOSIS — J0191 Acute recurrent sinusitis, unspecified: Secondary | ICD-10-CM | POA: Diagnosis not present

## 2018-10-04 DIAGNOSIS — F17200 Nicotine dependence, unspecified, uncomplicated: Secondary | ICD-10-CM | POA: Diagnosis not present

## 2018-10-04 DIAGNOSIS — J00 Acute nasopharyngitis [common cold]: Secondary | ICD-10-CM | POA: Diagnosis not present

## 2018-10-04 DIAGNOSIS — J449 Chronic obstructive pulmonary disease, unspecified: Secondary | ICD-10-CM | POA: Diagnosis not present

## 2018-10-04 DIAGNOSIS — J3489 Other specified disorders of nose and nasal sinuses: Secondary | ICD-10-CM | POA: Diagnosis not present

## 2018-10-04 DIAGNOSIS — Z789 Other specified health status: Secondary | ICD-10-CM | POA: Diagnosis not present

## 2018-10-04 DIAGNOSIS — R0602 Shortness of breath: Secondary | ICD-10-CM | POA: Diagnosis not present

## 2018-10-05 DIAGNOSIS — J4520 Mild intermittent asthma, uncomplicated: Secondary | ICD-10-CM | POA: Diagnosis not present

## 2018-10-05 DIAGNOSIS — J449 Chronic obstructive pulmonary disease, unspecified: Secondary | ICD-10-CM | POA: Diagnosis not present

## 2018-10-05 DIAGNOSIS — G4733 Obstructive sleep apnea (adult) (pediatric): Secondary | ICD-10-CM | POA: Diagnosis not present

## 2018-10-17 DIAGNOSIS — R5383 Other fatigue: Secondary | ICD-10-CM | POA: Diagnosis not present

## 2018-10-17 DIAGNOSIS — J301 Allergic rhinitis due to pollen: Secondary | ICD-10-CM | POA: Diagnosis not present

## 2018-10-17 DIAGNOSIS — J4550 Severe persistent asthma, uncomplicated: Secondary | ICD-10-CM | POA: Diagnosis not present

## 2018-10-17 DIAGNOSIS — G4733 Obstructive sleep apnea (adult) (pediatric): Secondary | ICD-10-CM | POA: Diagnosis not present

## 2018-10-17 DIAGNOSIS — F411 Generalized anxiety disorder: Secondary | ICD-10-CM | POA: Diagnosis not present

## 2018-10-18 DIAGNOSIS — J449 Chronic obstructive pulmonary disease, unspecified: Secondary | ICD-10-CM | POA: Diagnosis not present

## 2018-10-20 DIAGNOSIS — J449 Chronic obstructive pulmonary disease, unspecified: Secondary | ICD-10-CM | POA: Diagnosis not present

## 2018-10-20 DIAGNOSIS — J4520 Mild intermittent asthma, uncomplicated: Secondary | ICD-10-CM | POA: Diagnosis not present

## 2018-10-20 DIAGNOSIS — G4733 Obstructive sleep apnea (adult) (pediatric): Secondary | ICD-10-CM | POA: Diagnosis not present

## 2018-10-22 DIAGNOSIS — G629 Polyneuropathy, unspecified: Secondary | ICD-10-CM | POA: Diagnosis not present

## 2018-10-22 DIAGNOSIS — J449 Chronic obstructive pulmonary disease, unspecified: Secondary | ICD-10-CM | POA: Diagnosis not present

## 2018-10-30 DIAGNOSIS — J449 Chronic obstructive pulmonary disease, unspecified: Secondary | ICD-10-CM | POA: Diagnosis not present

## 2018-10-30 DIAGNOSIS — R0902 Hypoxemia: Secondary | ICD-10-CM | POA: Diagnosis not present

## 2018-10-30 DIAGNOSIS — J4520 Mild intermittent asthma, uncomplicated: Secondary | ICD-10-CM | POA: Diagnosis not present

## 2018-11-05 DIAGNOSIS — J449 Chronic obstructive pulmonary disease, unspecified: Secondary | ICD-10-CM | POA: Diagnosis not present

## 2018-11-05 DIAGNOSIS — J4520 Mild intermittent asthma, uncomplicated: Secondary | ICD-10-CM | POA: Diagnosis not present

## 2018-11-05 DIAGNOSIS — G4733 Obstructive sleep apnea (adult) (pediatric): Secondary | ICD-10-CM | POA: Diagnosis not present

## 2018-11-18 DIAGNOSIS — J449 Chronic obstructive pulmonary disease, unspecified: Secondary | ICD-10-CM | POA: Diagnosis not present

## 2018-11-20 DIAGNOSIS — J4520 Mild intermittent asthma, uncomplicated: Secondary | ICD-10-CM | POA: Diagnosis not present

## 2018-11-20 DIAGNOSIS — J449 Chronic obstructive pulmonary disease, unspecified: Secondary | ICD-10-CM | POA: Diagnosis not present

## 2018-11-20 DIAGNOSIS — G4733 Obstructive sleep apnea (adult) (pediatric): Secondary | ICD-10-CM | POA: Diagnosis not present

## 2018-11-21 DIAGNOSIS — R05 Cough: Secondary | ICD-10-CM | POA: Diagnosis not present

## 2018-11-21 DIAGNOSIS — J0101 Acute recurrent maxillary sinusitis: Secondary | ICD-10-CM | POA: Diagnosis not present

## 2018-12-02 DIAGNOSIS — J449 Chronic obstructive pulmonary disease, unspecified: Secondary | ICD-10-CM | POA: Diagnosis not present

## 2018-12-02 DIAGNOSIS — G4733 Obstructive sleep apnea (adult) (pediatric): Secondary | ICD-10-CM | POA: Diagnosis not present

## 2018-12-02 DIAGNOSIS — J4520 Mild intermittent asthma, uncomplicated: Secondary | ICD-10-CM | POA: Diagnosis not present

## 2018-12-04 DIAGNOSIS — J343 Hypertrophy of nasal turbinates: Secondary | ICD-10-CM | POA: Diagnosis not present

## 2018-12-04 DIAGNOSIS — R0981 Nasal congestion: Secondary | ICD-10-CM | POA: Diagnosis not present

## 2018-12-04 DIAGNOSIS — J4520 Mild intermittent asthma, uncomplicated: Secondary | ICD-10-CM | POA: Diagnosis not present

## 2018-12-04 DIAGNOSIS — G4733 Obstructive sleep apnea (adult) (pediatric): Secondary | ICD-10-CM | POA: Diagnosis not present

## 2018-12-04 DIAGNOSIS — J449 Chronic obstructive pulmonary disease, unspecified: Secondary | ICD-10-CM | POA: Diagnosis not present

## 2018-12-04 DIAGNOSIS — J0101 Acute recurrent maxillary sinusitis: Secondary | ICD-10-CM | POA: Diagnosis not present

## 2018-12-04 DIAGNOSIS — J322 Chronic ethmoidal sinusitis: Secondary | ICD-10-CM | POA: Diagnosis not present

## 2018-12-04 DIAGNOSIS — J329 Chronic sinusitis, unspecified: Secondary | ICD-10-CM | POA: Diagnosis not present

## 2018-12-06 DIAGNOSIS — H25813 Combined forms of age-related cataract, bilateral: Secondary | ICD-10-CM | POA: Diagnosis not present

## 2018-12-11 DIAGNOSIS — R0981 Nasal congestion: Secondary | ICD-10-CM | POA: Diagnosis not present

## 2018-12-11 DIAGNOSIS — J322 Chronic ethmoidal sinusitis: Secondary | ICD-10-CM | POA: Diagnosis not present

## 2018-12-11 DIAGNOSIS — J329 Chronic sinusitis, unspecified: Secondary | ICD-10-CM | POA: Diagnosis not present

## 2018-12-11 DIAGNOSIS — J320 Chronic maxillary sinusitis: Secondary | ICD-10-CM | POA: Diagnosis not present

## 2018-12-11 DIAGNOSIS — J0121 Acute recurrent ethmoidal sinusitis: Secondary | ICD-10-CM | POA: Diagnosis not present

## 2018-12-11 DIAGNOSIS — J343 Hypertrophy of nasal turbinates: Secondary | ICD-10-CM | POA: Diagnosis not present

## 2018-12-11 DIAGNOSIS — J310 Chronic rhinitis: Secondary | ICD-10-CM | POA: Diagnosis not present

## 2018-12-11 DIAGNOSIS — J0101 Acute recurrent maxillary sinusitis: Secondary | ICD-10-CM | POA: Diagnosis not present

## 2018-12-17 DIAGNOSIS — J449 Chronic obstructive pulmonary disease, unspecified: Secondary | ICD-10-CM | POA: Diagnosis not present

## 2018-12-18 DIAGNOSIS — J449 Chronic obstructive pulmonary disease, unspecified: Secondary | ICD-10-CM | POA: Diagnosis not present

## 2018-12-18 DIAGNOSIS — R0902 Hypoxemia: Secondary | ICD-10-CM | POA: Diagnosis not present

## 2018-12-18 DIAGNOSIS — G4733 Obstructive sleep apnea (adult) (pediatric): Secondary | ICD-10-CM | POA: Diagnosis not present

## 2018-12-19 DIAGNOSIS — G4733 Obstructive sleep apnea (adult) (pediatric): Secondary | ICD-10-CM | POA: Diagnosis not present

## 2018-12-19 DIAGNOSIS — J4520 Mild intermittent asthma, uncomplicated: Secondary | ICD-10-CM | POA: Diagnosis not present

## 2018-12-19 DIAGNOSIS — J449 Chronic obstructive pulmonary disease, unspecified: Secondary | ICD-10-CM | POA: Diagnosis not present

## 2018-12-20 DIAGNOSIS — J329 Chronic sinusitis, unspecified: Secondary | ICD-10-CM | POA: Diagnosis not present

## 2018-12-27 DIAGNOSIS — J329 Chronic sinusitis, unspecified: Secondary | ICD-10-CM | POA: Diagnosis not present

## 2019-01-02 DIAGNOSIS — G4733 Obstructive sleep apnea (adult) (pediatric): Secondary | ICD-10-CM | POA: Diagnosis not present

## 2019-01-02 DIAGNOSIS — J449 Chronic obstructive pulmonary disease, unspecified: Secondary | ICD-10-CM | POA: Diagnosis not present

## 2019-01-02 DIAGNOSIS — J4520 Mild intermittent asthma, uncomplicated: Secondary | ICD-10-CM | POA: Diagnosis not present

## 2019-01-03 DIAGNOSIS — J329 Chronic sinusitis, unspecified: Secondary | ICD-10-CM | POA: Diagnosis not present

## 2019-01-14 DIAGNOSIS — J329 Chronic sinusitis, unspecified: Secondary | ICD-10-CM | POA: Diagnosis not present

## 2019-01-14 DIAGNOSIS — F17200 Nicotine dependence, unspecified, uncomplicated: Secondary | ICD-10-CM | POA: Diagnosis not present

## 2019-01-14 DIAGNOSIS — J3489 Other specified disorders of nose and nasal sinuses: Secondary | ICD-10-CM | POA: Diagnosis not present

## 2019-01-17 DIAGNOSIS — J449 Chronic obstructive pulmonary disease, unspecified: Secondary | ICD-10-CM | POA: Diagnosis not present

## 2019-01-21 DIAGNOSIS — G4733 Obstructive sleep apnea (adult) (pediatric): Secondary | ICD-10-CM | POA: Diagnosis not present

## 2019-01-21 DIAGNOSIS — Z87891 Personal history of nicotine dependence: Secondary | ICD-10-CM | POA: Diagnosis not present

## 2019-01-21 DIAGNOSIS — J4550 Severe persistent asthma, uncomplicated: Secondary | ICD-10-CM | POA: Diagnosis not present

## 2019-01-28 DIAGNOSIS — G629 Polyneuropathy, unspecified: Secondary | ICD-10-CM | POA: Diagnosis not present

## 2019-01-28 DIAGNOSIS — J449 Chronic obstructive pulmonary disease, unspecified: Secondary | ICD-10-CM | POA: Diagnosis not present

## 2019-01-28 DIAGNOSIS — K5900 Constipation, unspecified: Secondary | ICD-10-CM | POA: Diagnosis not present

## 2019-03-04 DIAGNOSIS — J449 Chronic obstructive pulmonary disease, unspecified: Secondary | ICD-10-CM | POA: Diagnosis not present

## 2019-03-04 DIAGNOSIS — J439 Emphysema, unspecified: Secondary | ICD-10-CM | POA: Diagnosis not present

## 2019-03-18 DIAGNOSIS — G629 Polyneuropathy, unspecified: Secondary | ICD-10-CM | POA: Diagnosis not present

## 2019-03-18 DIAGNOSIS — G6289 Other specified polyneuropathies: Secondary | ICD-10-CM | POA: Diagnosis not present

## 2019-03-26 DIAGNOSIS — R42 Dizziness and giddiness: Secondary | ICD-10-CM | POA: Diagnosis not present

## 2019-03-26 DIAGNOSIS — R61 Generalized hyperhidrosis: Secondary | ICD-10-CM | POA: Diagnosis not present

## 2019-04-03 DIAGNOSIS — J449 Chronic obstructive pulmonary disease, unspecified: Secondary | ICD-10-CM | POA: Diagnosis not present

## 2019-04-03 DIAGNOSIS — E860 Dehydration: Secondary | ICD-10-CM | POA: Diagnosis not present

## 2019-04-03 DIAGNOSIS — J439 Emphysema, unspecified: Secondary | ICD-10-CM | POA: Diagnosis not present

## 2019-04-04 DIAGNOSIS — H02403 Unspecified ptosis of bilateral eyelids: Secondary | ICD-10-CM | POA: Diagnosis not present

## 2019-04-04 DIAGNOSIS — H02401 Unspecified ptosis of right eyelid: Secondary | ICD-10-CM | POA: Diagnosis not present

## 2019-04-04 DIAGNOSIS — H02402 Unspecified ptosis of left eyelid: Secondary | ICD-10-CM | POA: Diagnosis not present

## 2019-04-04 DIAGNOSIS — H353131 Nonexudative age-related macular degeneration, bilateral, early dry stage: Secondary | ICD-10-CM | POA: Diagnosis not present

## 2019-04-08 DIAGNOSIS — H9190 Unspecified hearing loss, unspecified ear: Secondary | ICD-10-CM | POA: Diagnosis not present

## 2019-04-08 DIAGNOSIS — J45909 Unspecified asthma, uncomplicated: Secondary | ICD-10-CM | POA: Diagnosis not present

## 2019-04-08 DIAGNOSIS — Z9889 Other specified postprocedural states: Secondary | ICD-10-CM | POA: Diagnosis not present

## 2019-04-08 DIAGNOSIS — Z8709 Personal history of other diseases of the respiratory system: Secondary | ICD-10-CM | POA: Diagnosis not present

## 2019-04-08 DIAGNOSIS — H6121 Impacted cerumen, right ear: Secondary | ICD-10-CM | POA: Diagnosis not present

## 2019-04-08 DIAGNOSIS — Z7951 Long term (current) use of inhaled steroids: Secondary | ICD-10-CM | POA: Diagnosis not present

## 2019-04-10 DIAGNOSIS — E860 Dehydration: Secondary | ICD-10-CM | POA: Diagnosis not present

## 2019-04-30 DIAGNOSIS — Z01818 Encounter for other preprocedural examination: Secondary | ICD-10-CM | POA: Diagnosis not present

## 2019-04-30 DIAGNOSIS — H02839 Dermatochalasis of unspecified eye, unspecified eyelid: Secondary | ICD-10-CM | POA: Diagnosis not present

## 2019-04-30 DIAGNOSIS — H02403 Unspecified ptosis of bilateral eyelids: Secondary | ICD-10-CM | POA: Diagnosis not present

## 2019-05-04 DIAGNOSIS — J449 Chronic obstructive pulmonary disease, unspecified: Secondary | ICD-10-CM | POA: Diagnosis not present

## 2019-05-04 DIAGNOSIS — J439 Emphysema, unspecified: Secondary | ICD-10-CM | POA: Diagnosis not present

## 2019-05-10 DIAGNOSIS — H02401 Unspecified ptosis of right eyelid: Secondary | ICD-10-CM | POA: Diagnosis not present

## 2019-05-10 DIAGNOSIS — H02834 Dermatochalasis of left upper eyelid: Secondary | ICD-10-CM | POA: Diagnosis not present

## 2019-05-10 DIAGNOSIS — H02831 Dermatochalasis of right upper eyelid: Secondary | ICD-10-CM | POA: Diagnosis not present

## 2019-05-10 DIAGNOSIS — H02402 Unspecified ptosis of left eyelid: Secondary | ICD-10-CM | POA: Diagnosis not present

## 2019-05-10 DIAGNOSIS — H02403 Unspecified ptosis of bilateral eyelids: Secondary | ICD-10-CM | POA: Diagnosis not present

## 2019-05-15 DIAGNOSIS — F411 Generalized anxiety disorder: Secondary | ICD-10-CM | POA: Diagnosis not present

## 2019-05-15 DIAGNOSIS — J301 Allergic rhinitis due to pollen: Secondary | ICD-10-CM | POA: Diagnosis not present

## 2019-05-15 DIAGNOSIS — G4733 Obstructive sleep apnea (adult) (pediatric): Secondary | ICD-10-CM | POA: Diagnosis not present

## 2019-05-15 DIAGNOSIS — J4550 Severe persistent asthma, uncomplicated: Secondary | ICD-10-CM | POA: Diagnosis not present

## 2019-06-04 DIAGNOSIS — J449 Chronic obstructive pulmonary disease, unspecified: Secondary | ICD-10-CM | POA: Diagnosis not present

## 2019-06-04 DIAGNOSIS — J439 Emphysema, unspecified: Secondary | ICD-10-CM | POA: Diagnosis not present

## 2019-06-05 DIAGNOSIS — G4733 Obstructive sleep apnea (adult) (pediatric): Secondary | ICD-10-CM | POA: Diagnosis not present

## 2019-06-05 DIAGNOSIS — J301 Allergic rhinitis due to pollen: Secondary | ICD-10-CM | POA: Diagnosis not present

## 2019-06-05 DIAGNOSIS — F411 Generalized anxiety disorder: Secondary | ICD-10-CM | POA: Diagnosis not present

## 2019-06-05 DIAGNOSIS — J4551 Severe persistent asthma with (acute) exacerbation: Secondary | ICD-10-CM | POA: Diagnosis not present

## 2019-06-11 DIAGNOSIS — B379 Candidiasis, unspecified: Secondary | ICD-10-CM | POA: Diagnosis not present

## 2019-06-11 DIAGNOSIS — K1230 Oral mucositis (ulcerative), unspecified: Secondary | ICD-10-CM | POA: Diagnosis not present

## 2019-07-04 DIAGNOSIS — J449 Chronic obstructive pulmonary disease, unspecified: Secondary | ICD-10-CM | POA: Diagnosis not present

## 2019-07-04 DIAGNOSIS — J439 Emphysema, unspecified: Secondary | ICD-10-CM | POA: Diagnosis not present

## 2019-07-24 ENCOUNTER — Encounter: Payer: Self-pay | Admitting: Gastroenterology

## 2019-08-04 DEATH — deceased
